# Patient Record
Sex: Female | Born: 1970 | Race: White | Hispanic: No | Smoking: Never smoker
Health system: Southern US, Academic
[De-identification: ages and names within clinical notes are randomized; demographics above are authoritative.]

## PROBLEM LIST (undated history)

## (undated) DIAGNOSIS — R569 Unspecified convulsions: Secondary | ICD-10-CM

## (undated) DIAGNOSIS — F419 Anxiety disorder, unspecified: Secondary | ICD-10-CM

## (undated) DIAGNOSIS — G35 Multiple sclerosis: Secondary | ICD-10-CM

## (undated) HISTORY — DX: Unspecified convulsions (CMS HCC): R56.9

## (undated) HISTORY — DX: Anxiety disorder, unspecified: F41.9

## (undated) HISTORY — DX: Multiple sclerosis (CMS HCC): G35

## (undated) NOTE — Progress Notes (Signed)
 Formatting of this note is different from the original.  Subjective   Patient ID: Carol Barajas is a 52 y.o. female presenting to the Urgent Care with a chief complaint of Diarrhea (Pt has had diarrhea and vomiting x 2 days).    Nausea and vomiting and diarrhea x 2 days was exposed to the stomach virus she reports     History provided by:  Patient  Diarrhea  Quality:  Watery  Severity:  Mild  Onset quality:  Sudden  Number of episodes:  3  Duration:  3 days  Timing:  Intermittent  Progression:  Unchanged  Relieved by:  Nothing  Worsened by:  Nothing  Ineffective treatments:  None tried  Associated symptoms: vomiting    Risk factors: sick contacts      Objective   Vitals:    03/10/24 1001   BP: 122/82   BP Location: Right arm   Patient Position: Sitting   BP Cuff Size: Adult   Pulse: (!) 101   Temp: 36.7 C (98.1 F)   TempSrc: Oral   Weight: 64.4 kg (142 lb)   Height: 1.575 m (5' 2)   BMI (Calculated): 25.97 kg/m2   BSA (Calculated - sq m): 1.68 sq meters   Resp: 17   SpO2: 95%   PainSc: 0-No pain     OB Vitals  OB Status Postmenopausal     Social History     Tobacco Use   Smoking Status Never    Passive exposure: Never   Smokeless Tobacco Never       Vital signs reviewed.    Physical Exam  Constitutional:       Appearance: Normal appearance. She is normal weight.   HENT:      Head: Normocephalic.      Right Ear: Tympanic membrane, ear canal and external ear normal.      Left Ear: Tympanic membrane, ear canal and external ear normal.      Mouth/Throat:      Mouth: Mucous membranes are dry.      Pharynx: Oropharynx is clear.   Eyes:      Conjunctiva/sclera: Conjunctivae normal.   Cardiovascular:      Rate and Rhythm: Normal rate and regular rhythm.   Pulmonary:      Effort: Pulmonary effort is normal.   Abdominal:      General: Bowel sounds are normal.   Skin:     General: Skin is warm and dry.   Neurological:      General: No focal deficit present.      Mental Status: She is alert and oriented to person, place,  and time. Mental status is at baseline.   Psychiatric:         Mood and Affect: Mood normal.         Behavior: Behavior normal.         Thought Content: Thought content normal.         Judgment: Judgment normal.         Assessment & Plan  Cyclical vomiting associated with intractable migraine    Orders:    POCT Influenza A/B    Bilious vomiting with nausea    Orders:    ondansetron (Zofran) injection 4 mg    ondansetron ODT (Zofran-ODT) 4 MG disintegrating tablet; Place 1 tablet under the tongue 3 times a day as needed for nausea or vomiting for up to 5 days.    Diarrhea, unspecified type    Recommend OTC Imodium  as package directed   Other orders    saccharomyces boulardii (Florastor) 250 MG capsule; Take 1 capsule by mouth in the morning and 1 capsule before bedtime. Do all this for 7 days.    In-House Lab Results:     Results for orders placed or performed in visit on 03/10/24   POCT Influenza A/B    Collection Time: 03/10/24 10:26 AM   Result Value Ref Range    Rapid Influenza A Ag Negative Negative    Rapid Influenza B Ag Negative Negative       In-House Imaging Reads:        Procedure Documentation:  Procedures         ED Course & MDM   MDM - Medical Decision Making: Independent historian used.     Electronically signed by Avelina Kerns, CRNP at 03/10/2024 11:08 AM EST

---

## 1993-05-26 ENCOUNTER — Inpatient Hospital Stay (HOSPITAL_COMMUNITY): Payer: Self-pay | Admitting: OBSTETRICS/GYNECOLOGY

## 2009-05-25 DIAGNOSIS — K529 Noninfective gastroenteritis and colitis, unspecified: Secondary | ICD-10-CM | POA: Insufficient documentation

## 2011-01-13 DIAGNOSIS — G40909 Epilepsy, unspecified, not intractable, without status epilepticus: Secondary | ICD-10-CM | POA: Insufficient documentation

## 2011-01-14 DIAGNOSIS — F32A Depression, unspecified: Secondary | ICD-10-CM | POA: Insufficient documentation

## 2011-01-14 DIAGNOSIS — G4730 Sleep apnea, unspecified: Secondary | ICD-10-CM | POA: Insufficient documentation

## 2011-01-14 DIAGNOSIS — D509 Iron deficiency anemia, unspecified: Secondary | ICD-10-CM | POA: Insufficient documentation

## 2011-01-14 DIAGNOSIS — G35 Multiple sclerosis: Secondary | ICD-10-CM | POA: Insufficient documentation

## 2012-04-23 DIAGNOSIS — R1011 Right upper quadrant pain: Secondary | ICD-10-CM

## 2014-05-24 IMAGING — CT CT ABDOMEN&PELVIS W/WO CONTRAST
1 of 4 series · 9 of 32 positions shown, 15 images · IV contrast (APPLIED)
Comparison: None.

Exam:   
CT abdomen and pelvis without and with contrast, low dose Safe CT protocol
INDICATION: Mid abdominal pain and weight loss.
TECHNIQUE: Axial CT imaging of the abdomen and pelvis was performed without and with 100 cc of Optiray 300. Oral contrast was also administered. Additional reformatted sagittal and coronal projections were also obtained. Radiation dose 654 mGy cm.

[Series 906: abd venous (safect) · axial · portal-venous · 0.74mm/px · z∈[-698,-365]mm · 9 of 139 slices shown, 15 images]
[im 14/139  soft-tissue]
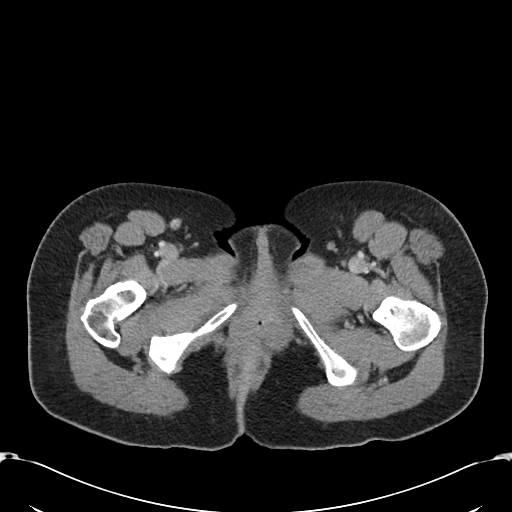
[im 14/139  bone]
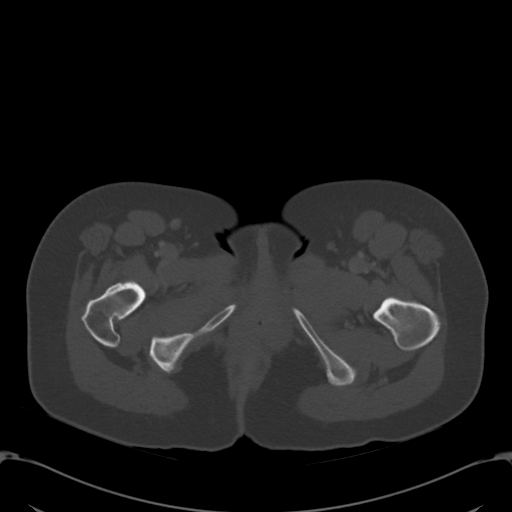
[im 28/139  soft-tissue]
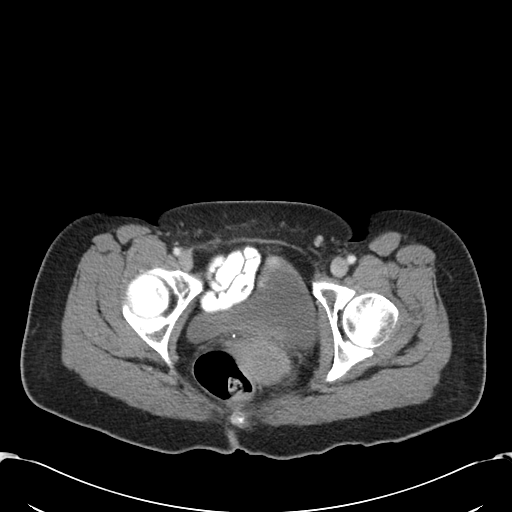
[im 42/139  soft-tissue]
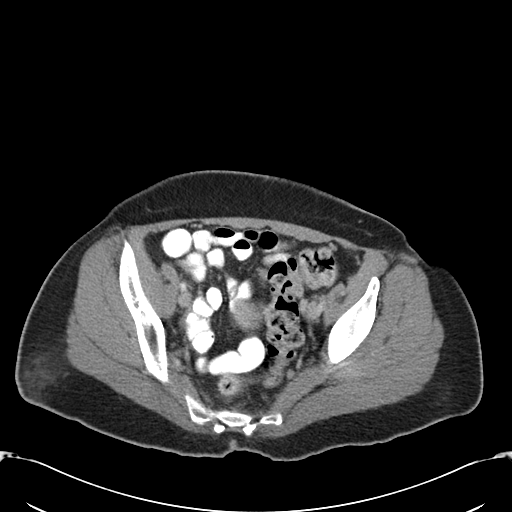
[im 56/139  soft-tissue]
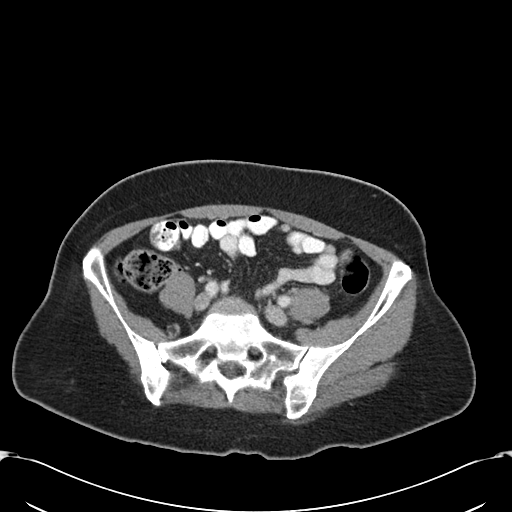
[im 70/139  soft-tissue]
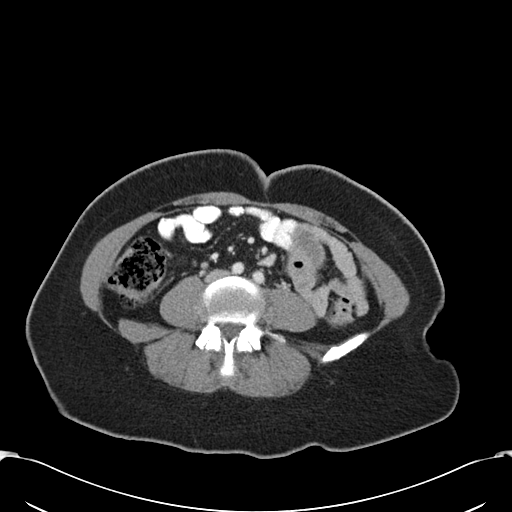
[im 83/139  soft-tissue]
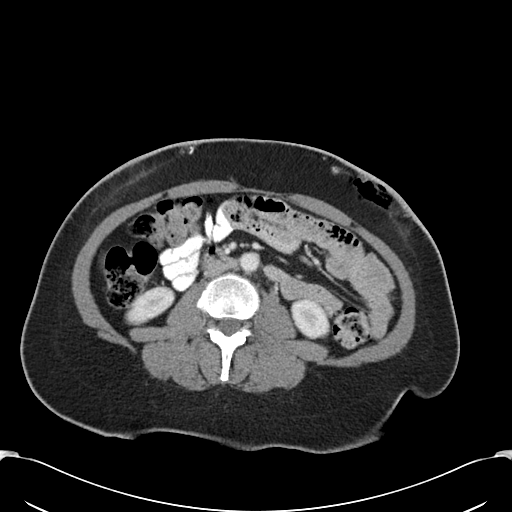
[im 83/139  lung]
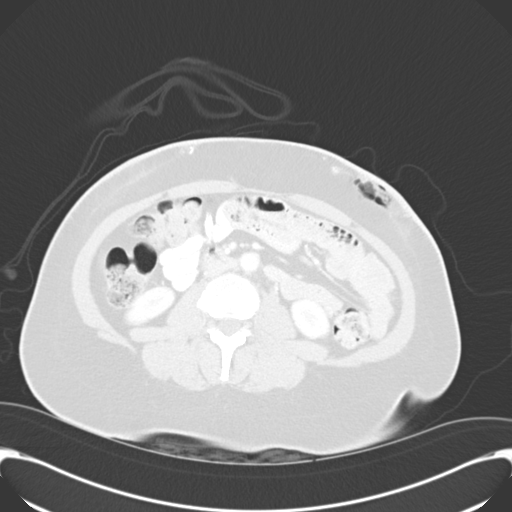
[im 97/139  soft-tissue]
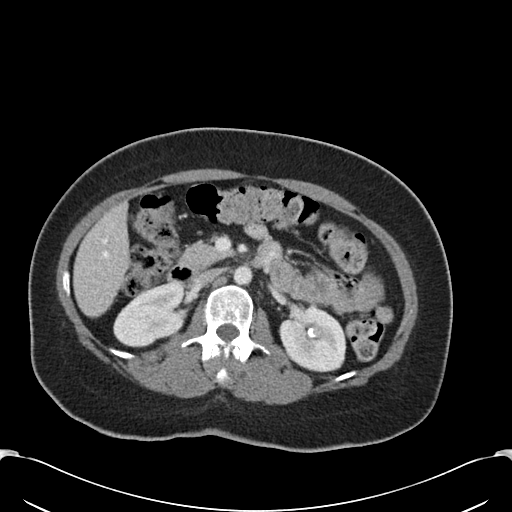
[im 97/139  lung]
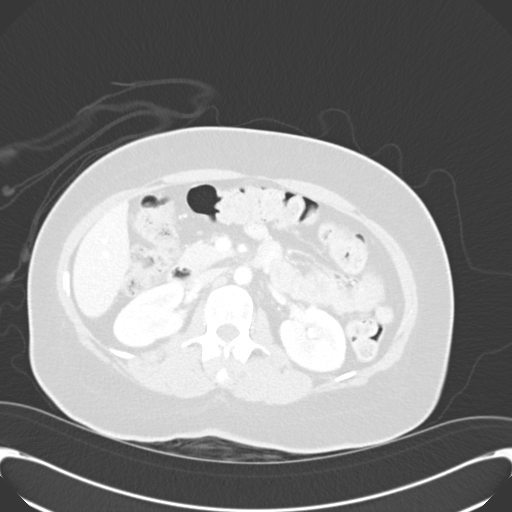
[im 111/139  soft-tissue]
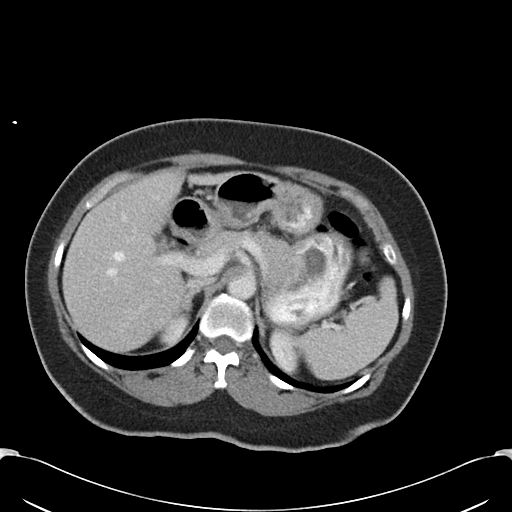
[im 111/139  lung]
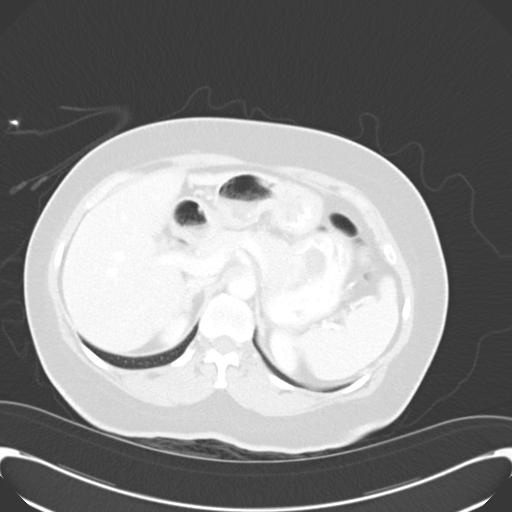
[im 125/139  soft-tissue]
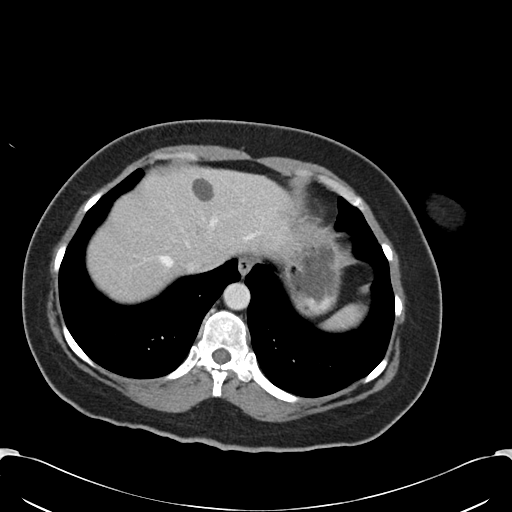
[im 125/139  lung]
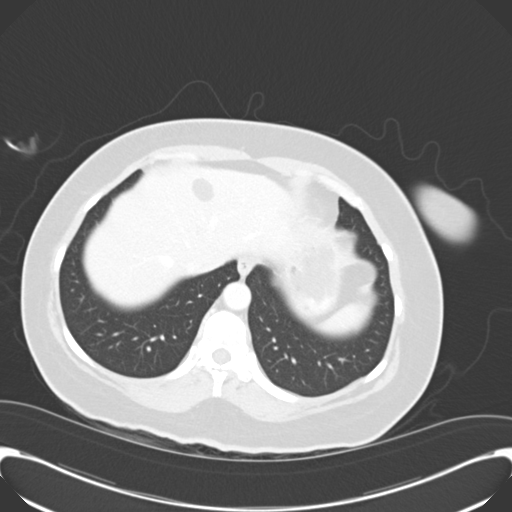
[im 125/139  bone]
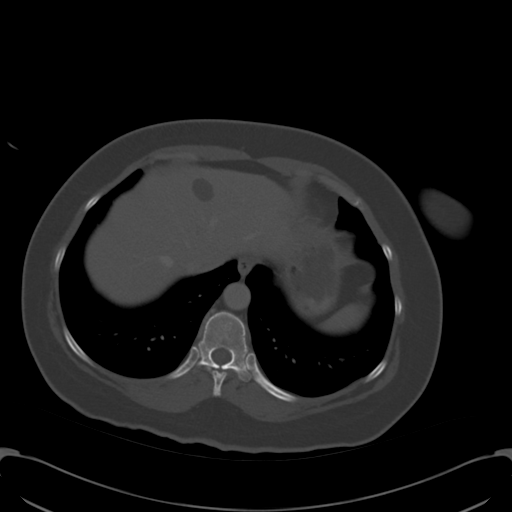

[9 of 32 positions shown; findings below may reference images not displayed]

FINDINGS: ABDOMEN:
Limited visualization of lung bases is unremarkable. There is no pleural or pericardial effusion. 
There are non obstructing bilateral renal calculi. A duplicated left sided collecting system is also noted. There are a few left hepatic lobe cysts measuring up to 1.8 cm. Gallbladder, spleen, pancreas and adrenal glands are normal. Bowel loops are normal in course and caliber; there is no obstruction or free air. There is no adenopathy. Calcifications and air within the subcutaneous tissues of the upper abdomen may represent subcutaneous injection sites.
PELVIS:
There is no acute inflammatory process. The appendix is normal. Moderate amount of stool is seen throughout the colon. There is no pelvic free fluid. There is no adenopathy. Osseous structures are unremarkable.
IMPRESSION: 1.
No acute abnormality within the upper abdomen and pelvis. The appendix is normal. 
2.
Small left hepatic lobe cysts. 

3.
Non obstructing bilateral renal calculi. There is no evidence of obstructive uropathy.

## 2020-01-24 IMAGING — MR MRI BRAIN WITHOUT CONTRAST
8 of 10 series · 32 of 48 positions shown · non-contrast
Comparison: None available.

﻿EXAM:  MRI BRAIN WITHOUT CONTRAST
INDICATION: History of multiple sclerosis.
TECHNIQUE: Noncontrast multiplanar multisequential MRI of the brain was performed.

[Series 9: DWI · axial · 5.0mm · 1.35mm/px · z∈[-53,+73]mm · 8 of 88 slices shown (1 of 3)]
[im 1/88]
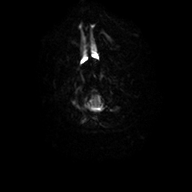
[im 10/88]
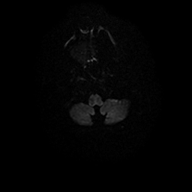
[im 30/88]
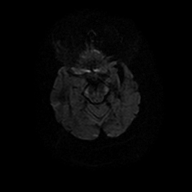
[im 39/88]
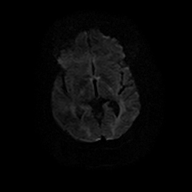
[im 49/88]
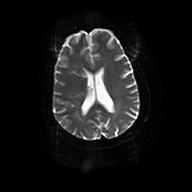
[im 59/88]
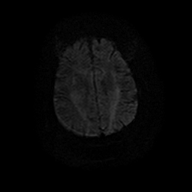
[im 78/88]
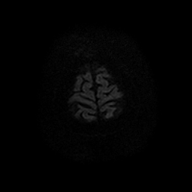
[im 88/88]
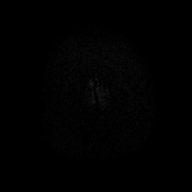

[Series 10: DWI · axial · 5.0mm · 1.35mm/px · z∈[-53,+73]mm · 2 of 22 slices shown (2 of 3)]
[im 1/22]
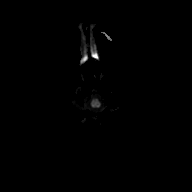
[im 22/22]
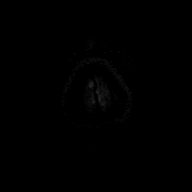

[Series 11: DWI · axial · 5.0mm · 1.35mm/px · z∈[-53,+73]mm · 3 of 22 slices shown (3 of 3)]
[im 1/22]
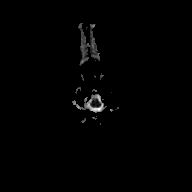
[im 11/22]
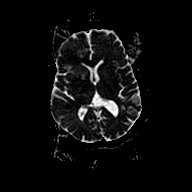
[im 22/22]
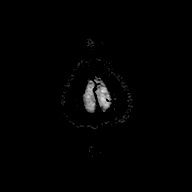

[Series 12: FLAIR · sagittal · 5.0mm · 0.75mm/px · 4 of 28 slices shown (1 of 2)]
[im 1/28]
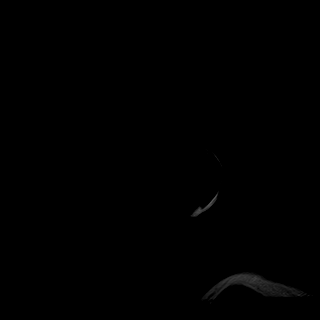
[im 10/28]
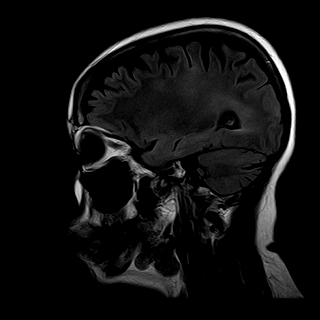
[im 19/28]
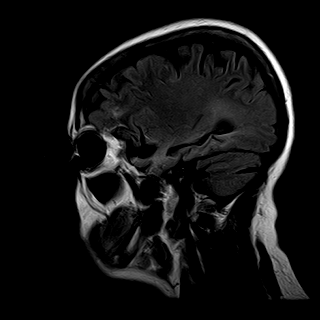
[im 28/28]
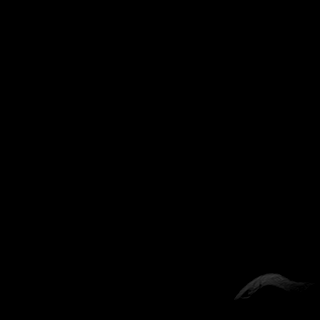

[Series 13: PD · axial · 4.0mm · 0.43mm/px · 1 of 36 slices shown]
[im 1/36]
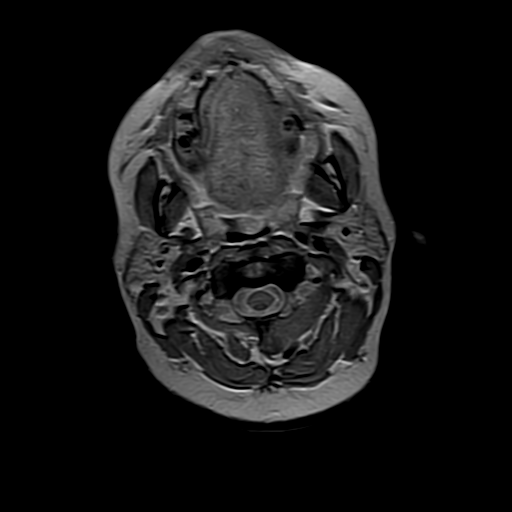

[Series 15: FLAIR · axial · 4.0mm · 0.43mm/px · z∈[-69,+85]mm · 5 of 36 slices shown (2 of 2)]
[im 1/36]
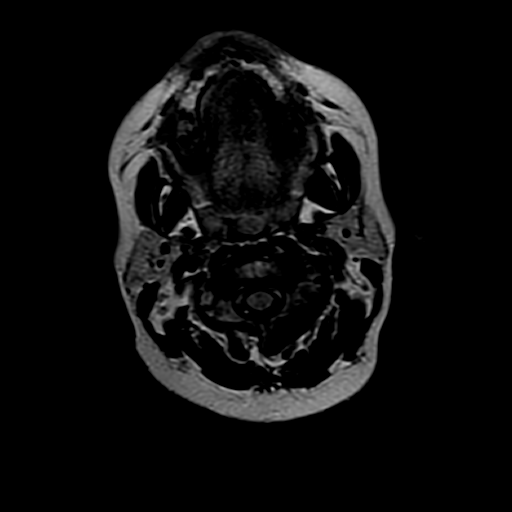
[im 9/36]
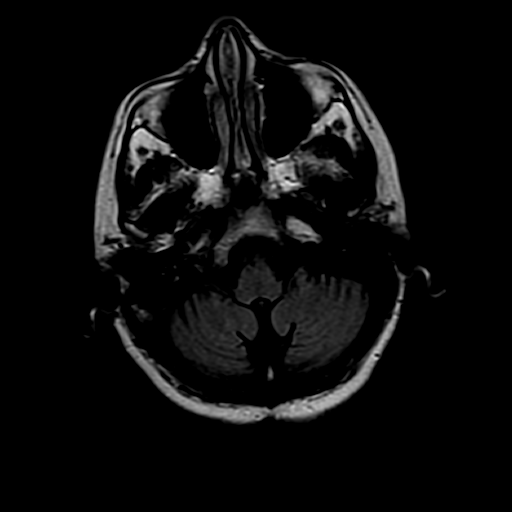
[im 18/36]
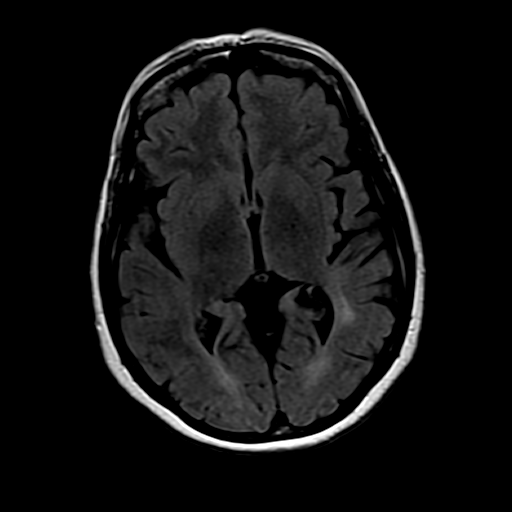
[im 27/36]
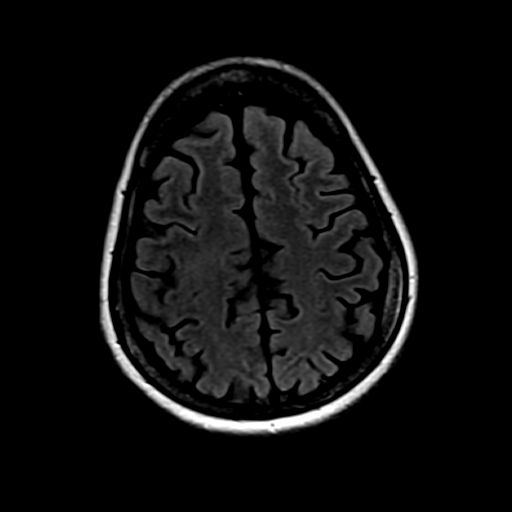
[im 36/36]
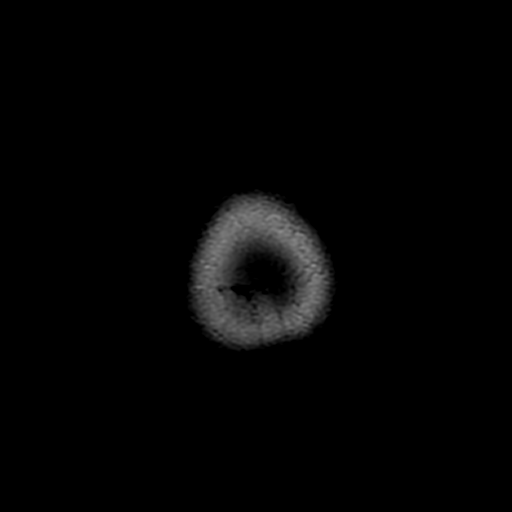

[Series 17: T1 · axial · 4.0mm · 0.43mm/px · z∈[-69,+85]mm · 5 of 36 slices shown]
[im 1/36]
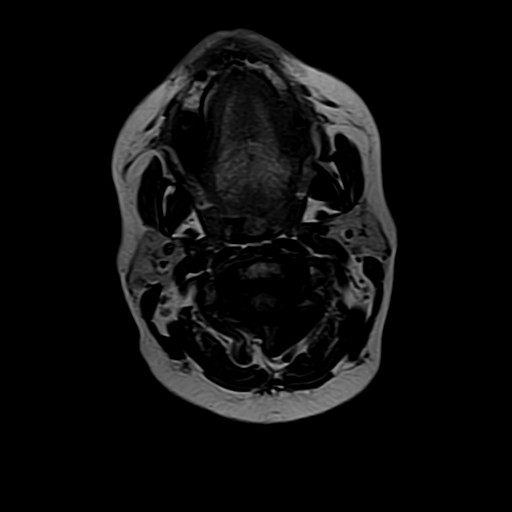
[im 9/36]
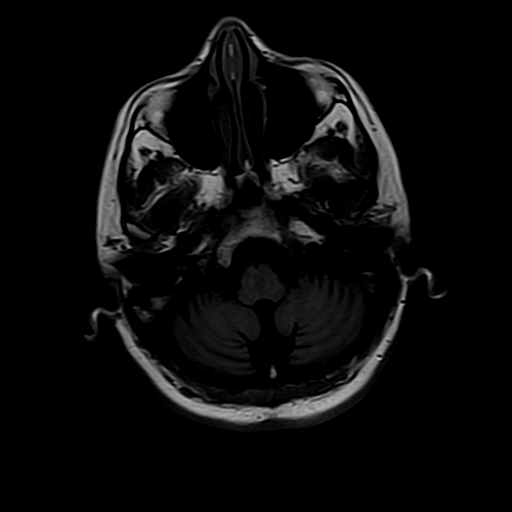
[im 18/36]
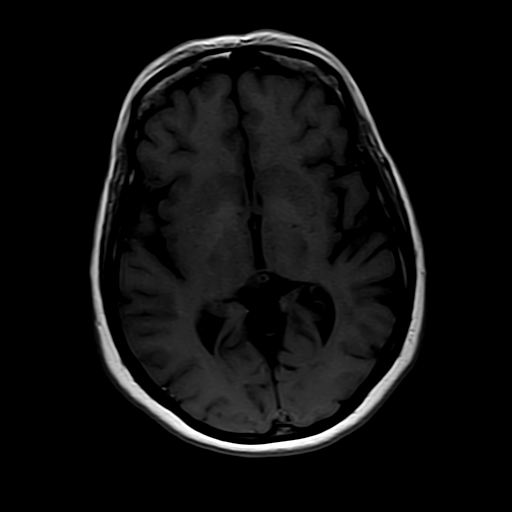
[im 27/36]
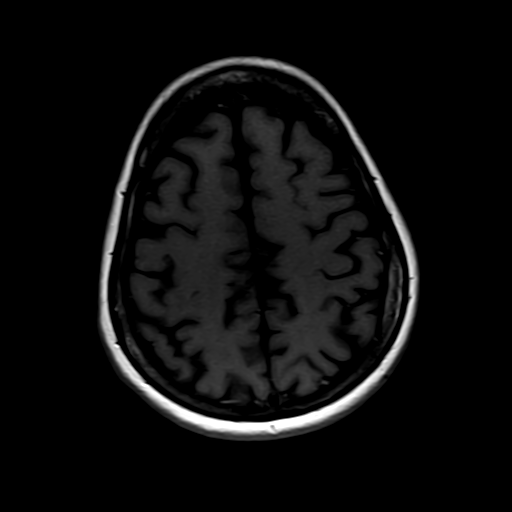
[im 36/36]
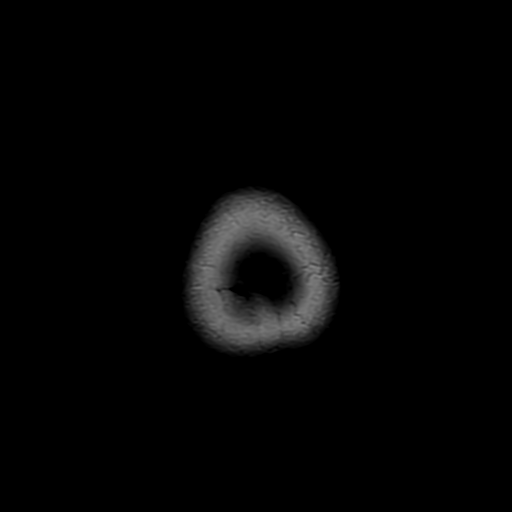

[Series 18: T2 · coronal · 5.0mm · 0.43mm/px · 4 of 28 slices shown]
[im 1/28]
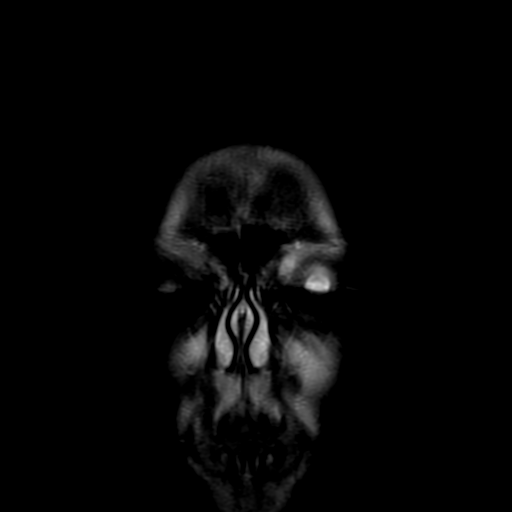
[im 10/28]
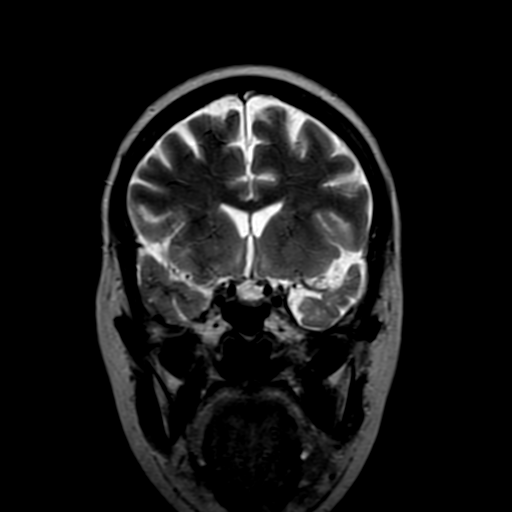
[im 19/28]
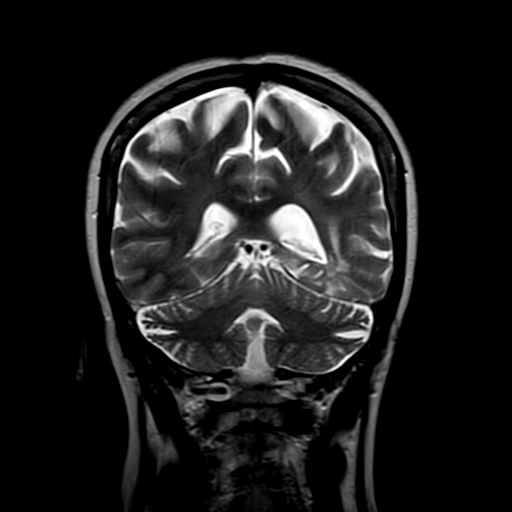
[im 28/28]
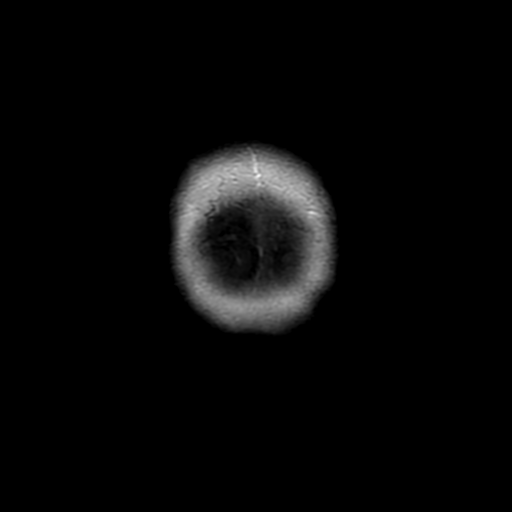

[32 of 48 positions shown; findings below may reference images not displayed]

FINDINGS: There is moderate cortical atrophy. There is a single 7.5 mm T2/FLAIR signal hyperintensity lesion abutting the right lateral ventricle. A small similar signal abnormality area is also noted within the anterior subcortical left frontal lobe. Confluent abnormal T2/FLAIR signal hyperintensity within the left temporal lobe with volume loss is likely from remote infarction. There is no mass effect, midline shift or intracranial hemorrhage. There is no evidence of acute infarction or prior microhemorrhages. Skull base flow voids and basal cisterns are patent. Sagittal survey of midline structures is unremarkable. There are no extra-axial fluid collections. Visualized paranasal sinuses, mastoid air cells and orbital contents are unremarkable.
IMPRESSION: Suggestion of a chronic left temporal lobe infarct. No convincing evidence of demyelinating disease. Continued clinical follow-up is recommended.

## 2020-02-13 IMAGING — MR MRI CERVICAL SPINE WITHOUT CONTRAST
6 series · 48 of 48 positions shown · non-contrast
Comparison: MRI brain dated 01/24/2020.

﻿EXAM:  MRI CERVICAL SPINE WITHOUT CONTRAST

EXAM:  MRI THORACIC SPINE WITHOUT CONTRAST
INDICATION: 49-year-old female with history of multiple sclerosis.
TECHNIQUE: Sagittal, coronal and axial images including T1, T2 and STIR sequences through cervical spine and thoracic spine.

[Series 4: s-map · sagittal · 8.8mm · 4.38mm/px · 29 of 100 slices shown]
[im 1/100]
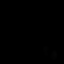
[im 4/100]
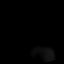
[im 8/100]
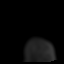
[im 11/100]
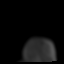
[im 15/100]
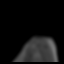
[im 18/100]
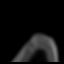
[im 22/100]
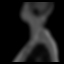
[im 25/100]
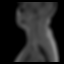
[im 29/100]
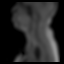
[im 32/100]
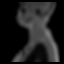
[im 36/100]
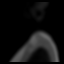
[im 39/100]
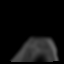
[im 43/100]
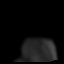
[im 46/100]
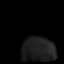
[im 50/100]
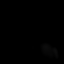
[im 54/100]
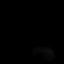
[im 57/100]
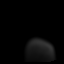
[im 61/100]
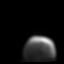
[im 64/100]
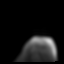
[im 68/100]
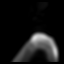
[im 71/100]
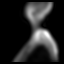
[im 75/100]
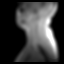
[im 78/100]
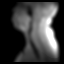
[im 82/100]
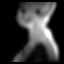
[im 85/100]
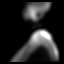
[im 89/100]
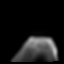
[im 92/100]
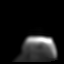
[im 96/100]
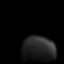
[im 100/100]
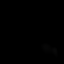

[Series 6: T2 · sagittal · 3.0mm · 0.78mm/px · 3 of 11 slices shown (1 of 2)]
[im 1/11]
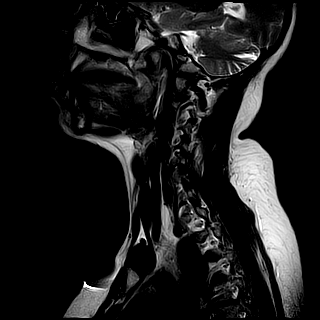
[im 6/11]
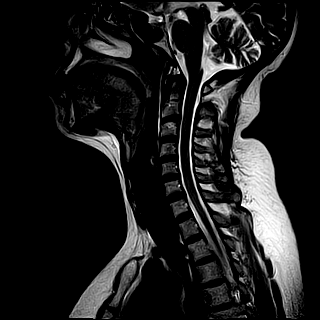
[im 11/11]
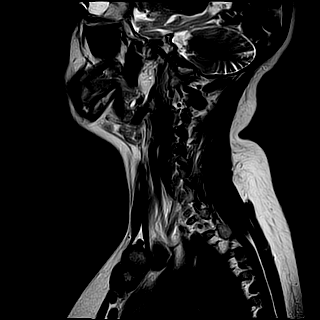

[Series 7: T1 · sagittal · 3.0mm · 0.49mm/px · 3 of 11 slices shown]
[im 1/11]
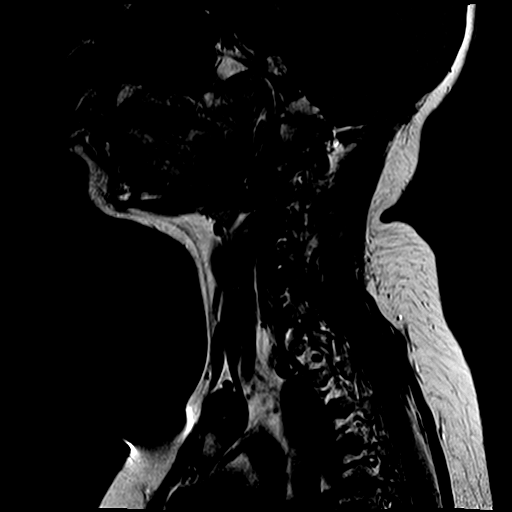
[im 6/11]
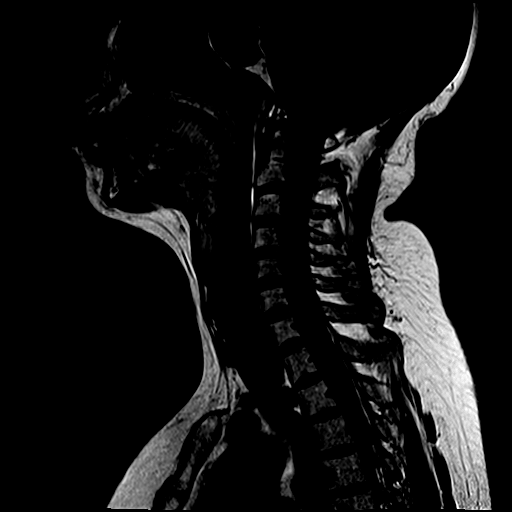
[im 11/11]
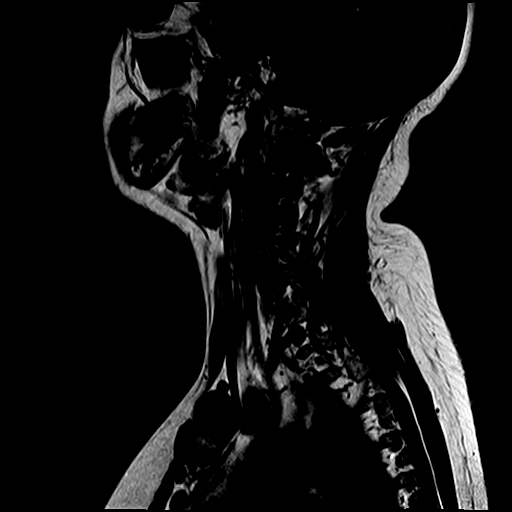

[Series 9: STIR · sagittal · 3.0mm · 0.49mm/px · 3 of 11 slices shown]
[im 1/11]
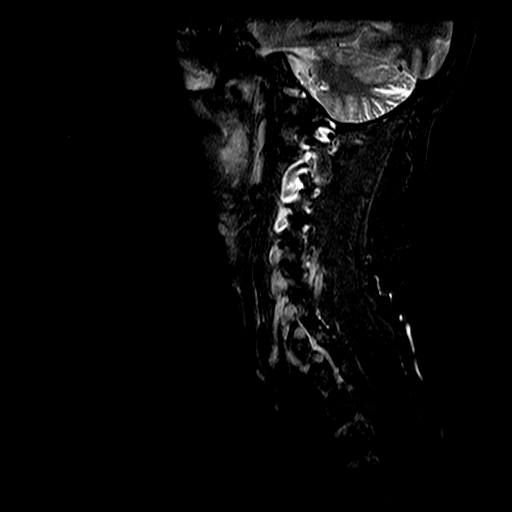
[im 6/11]
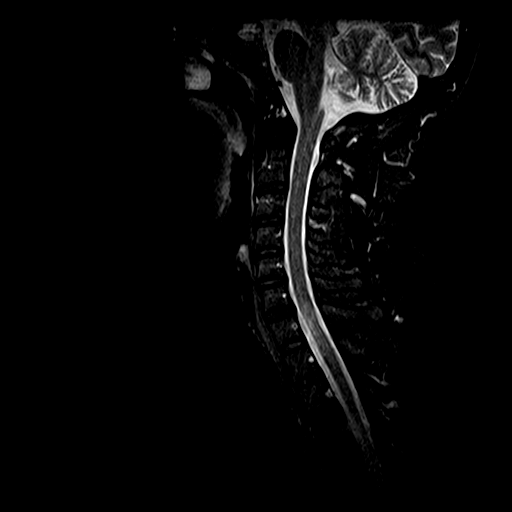
[im 11/11]
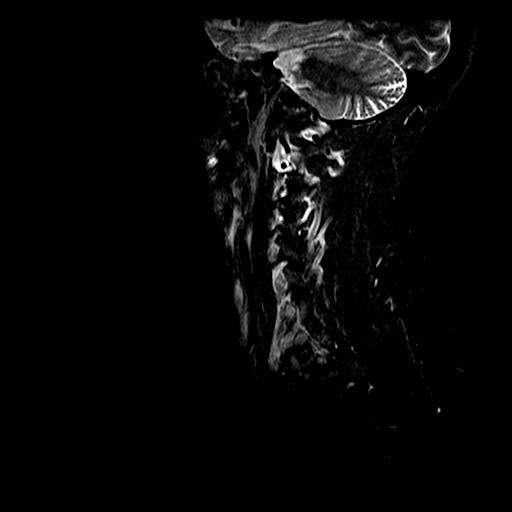

[Series 10: T2-star · axial · 3.5mm · 0.31mm/px · z∈[-69,+28]mm · 5 of 18 slices shown]
[im 1/18]
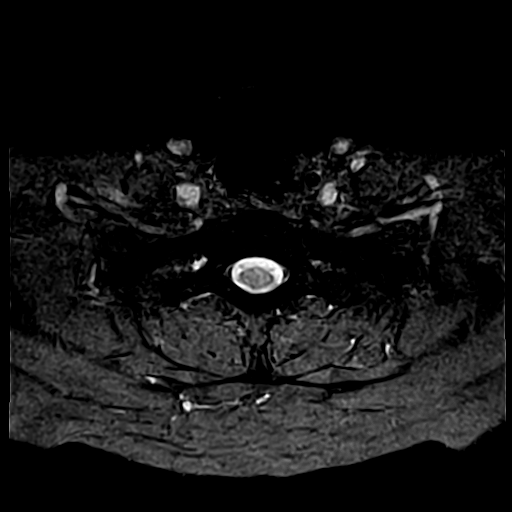
[im 5/18]
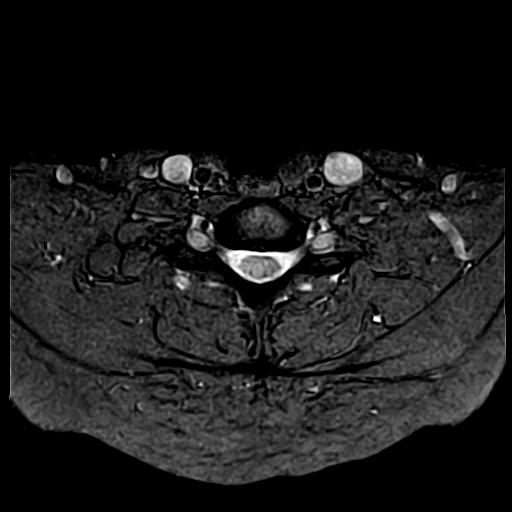
[im 9/18]
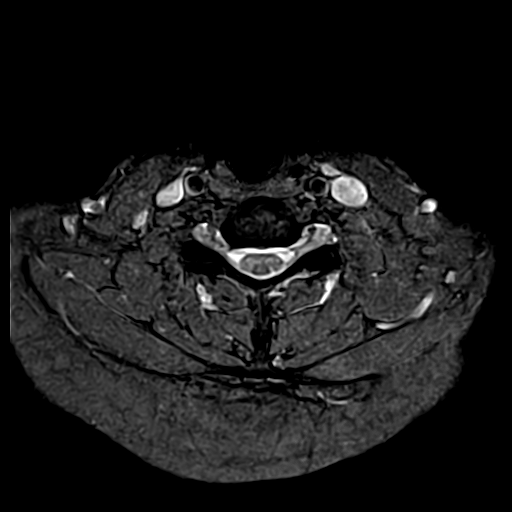
[im 13/18]
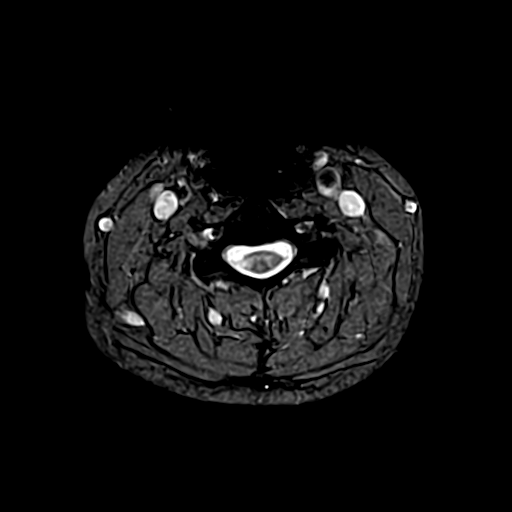
[im 18/18]
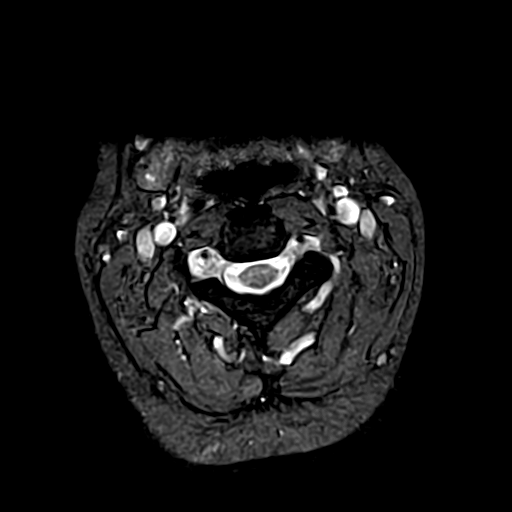

[Series 11: T2 · axial · 3.5mm · 0.31mm/px · z∈[-69,+28]mm · 5 of 18 slices shown (2 of 2)]
[im 1/18]
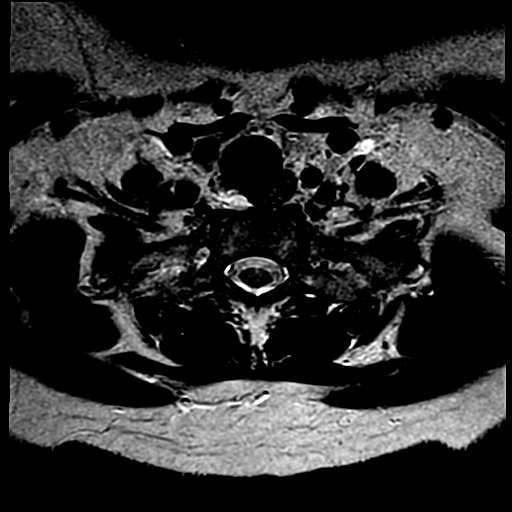
[im 5/18]
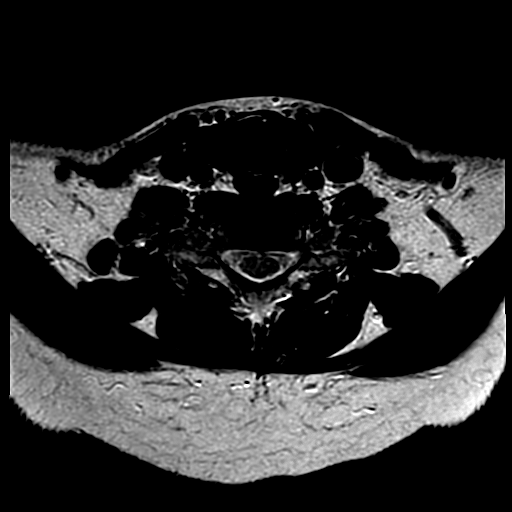
[im 9/18]
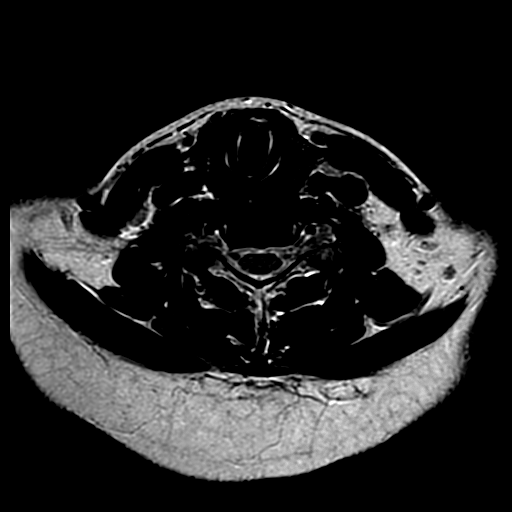
[im 13/18]
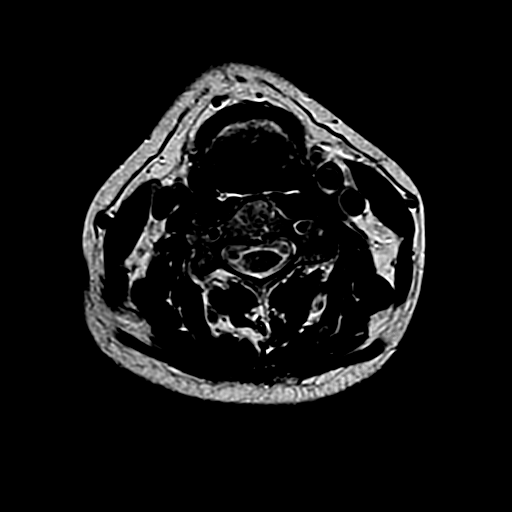
[im 18/18]
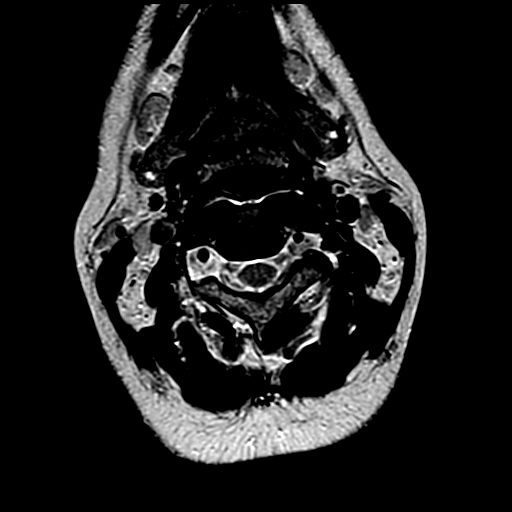

[48 of 48 positions shown; findings below may reference images not displayed]

FINDINGS: No acute bony lesions of cervical vertebrae are seen.  Posterior fossa and foramen magnum structures are normal in the sagittal projection. 

No significant cervical disc herniation is noted.  At C6-C7 level, mild degenerative disc disease with asymmetric bulging annulus and osteophyte complex causing mild bilateral foraminal narrowing, left more than the right.  

No focal lesions of thoracic vertebral body are noted.  No thoracic disc herniation, spinal stenosis or foraminal stenosis is noted.  

Cervical and thoracic spinal cord do not show any focal abnormalities or signal abnormalities.  No syringomyelia is seen.  Paravertebral soft tissues are normal.
IMPRESSION: 1. No focal bone changes of cervical and thoracic vertebrae.  No significant disc herniation or spinal stenosis is noted.  

2. No focal lesions of the spinal cord in the cervical and thoracic regions are noted in this examination.  Paravertebral soft tissues are unremarkable.

## 2020-02-13 IMAGING — MR MRI THORACIC SPINE WITHOUT CONTRAST
4 series · 21 of 48 positions shown · non-contrast
Comparison: MRI brain dated 01/24/2020.

﻿EXAM:  MRI CERVICAL SPINE WITHOUT CONTRAST

EXAM:  MRI THORACIC SPINE WITHOUT CONTRAST
INDICATION: 49-year-old female with history of multiple sclerosis.
TECHNIQUE: Sagittal, coronal and axial images including T1, T2 and STIR sequences through cervical spine and thoracic spine.

[Series 6: T2 · sagittal · 4.0mm · 0.78mm/px · 9 of 13 slices shown]
[im 1/13]
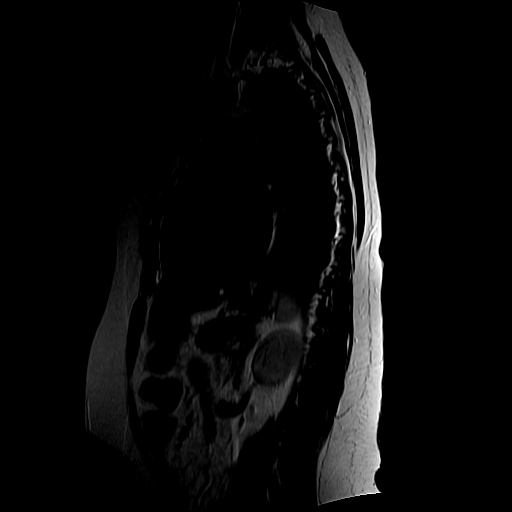
[im 3/13]
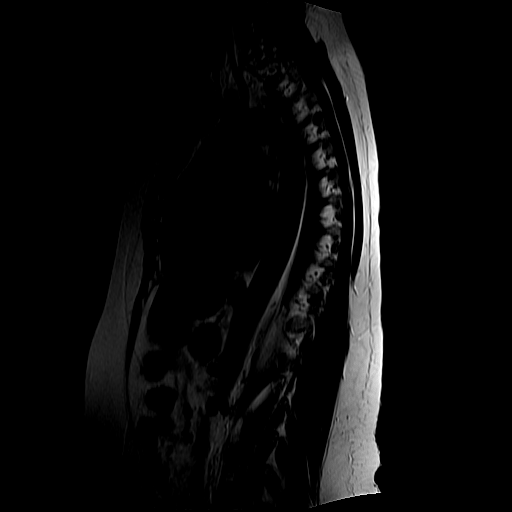
[im 5/13]
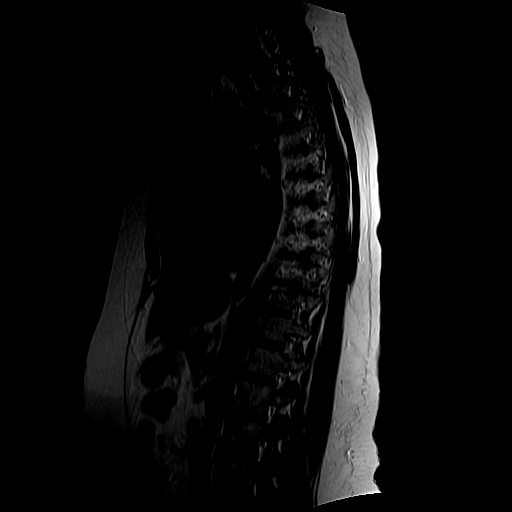
[im 6/13]
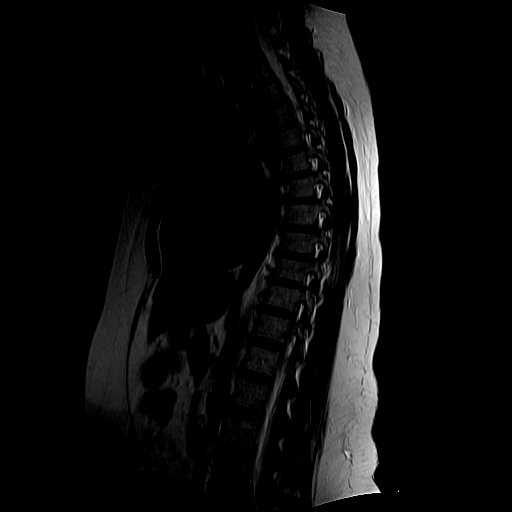
[im 7/13]
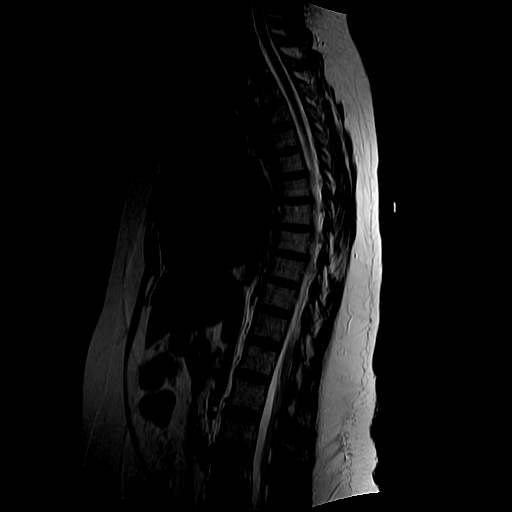
[im 8/13]
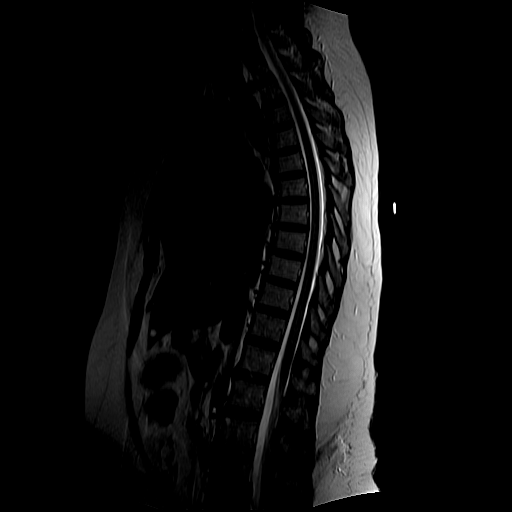
[im 9/13]
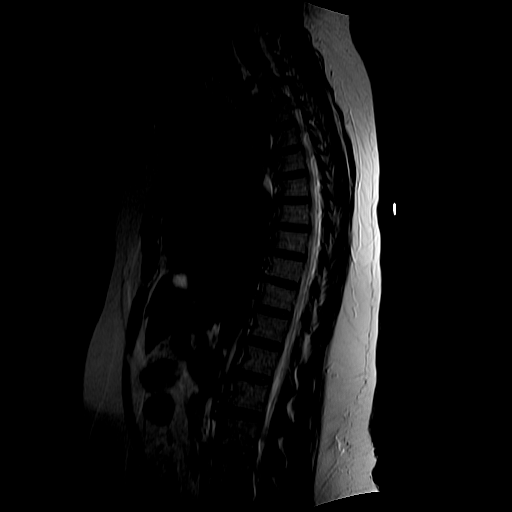
[im 11/13]
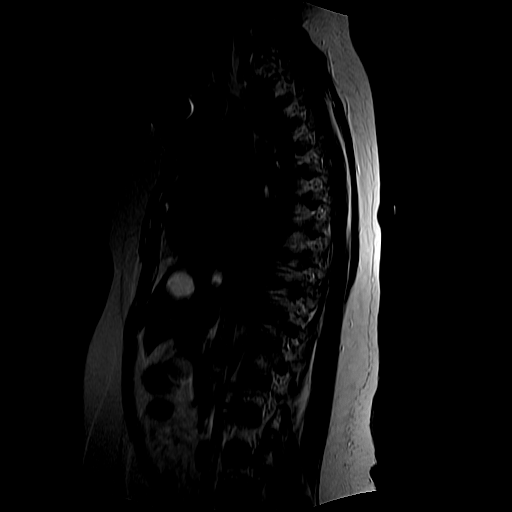
[im 13/13]
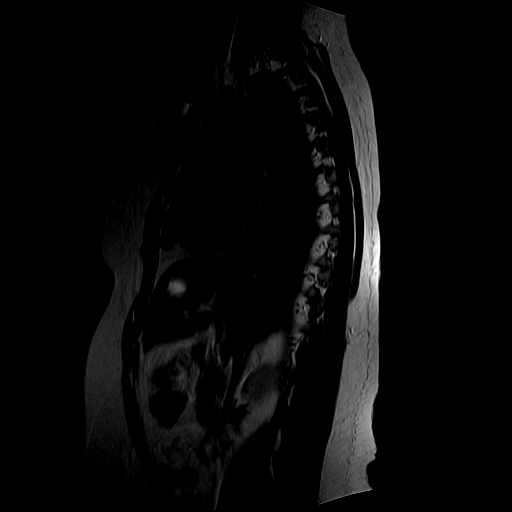

[Series 7: T1 · sagittal · 4.0mm · 0.78mm/px · 6 of 13 slices shown (1 of 2)]
[im 1/13]
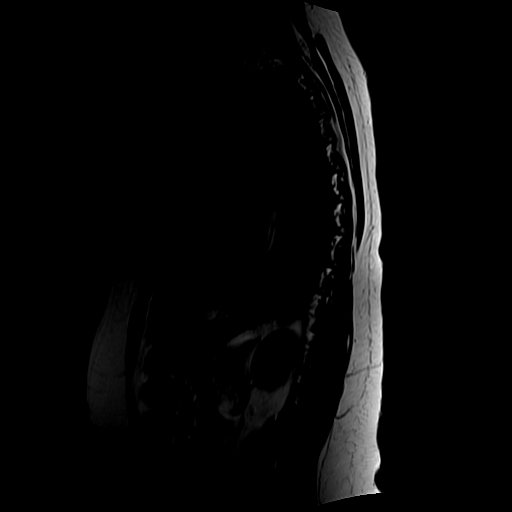
[im 3/13]
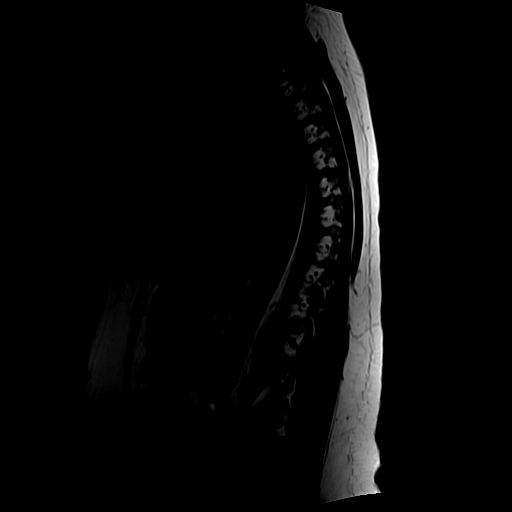
[im 4/13]
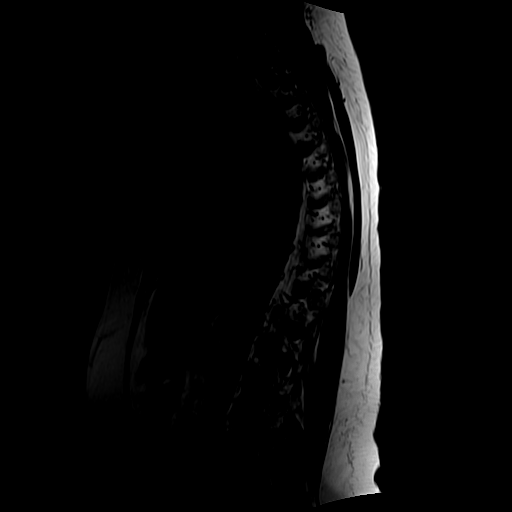
[im 6/13]
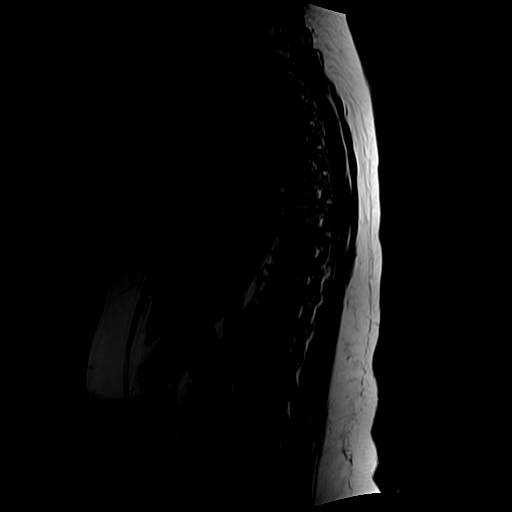
[im 7/13]
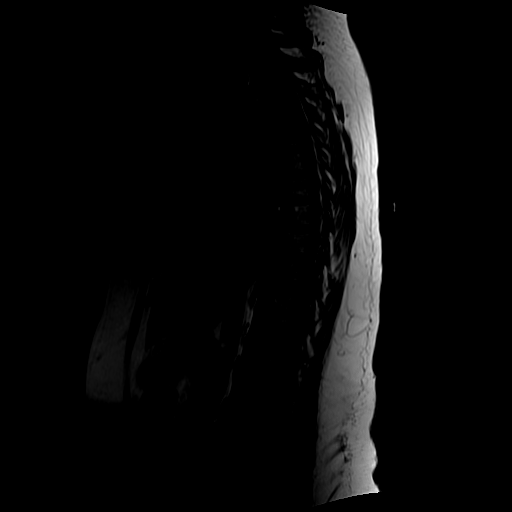
[im 11/13]
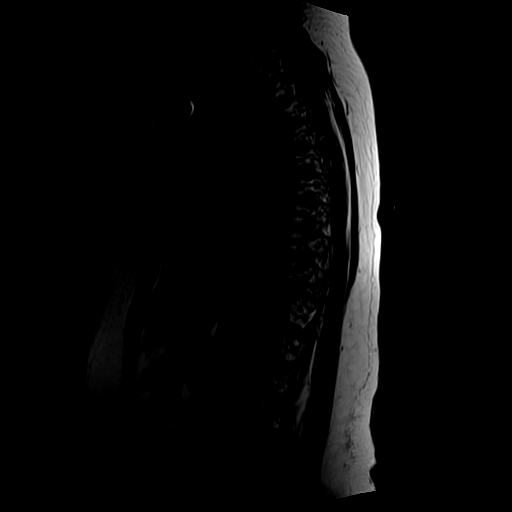

[Series 8: STIR · sagittal · 4.0mm · 0.78mm/px · 3 of 13 slices shown]
[im 3/13]
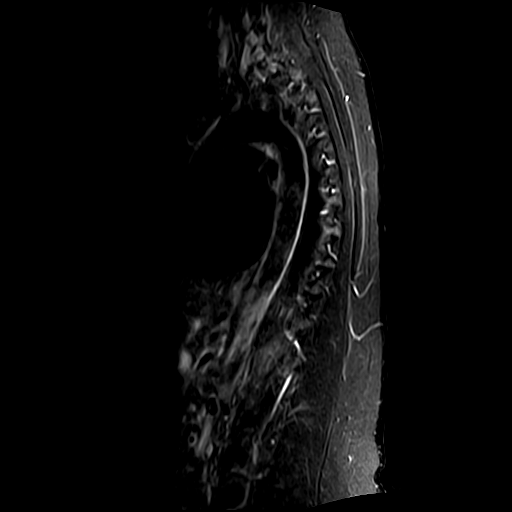
[im 7/13]
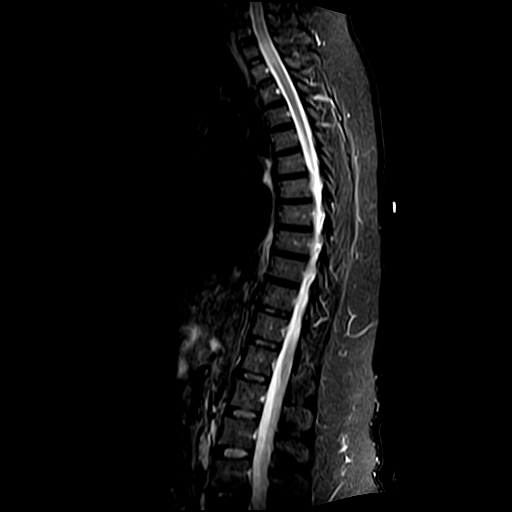
[im 11/13]
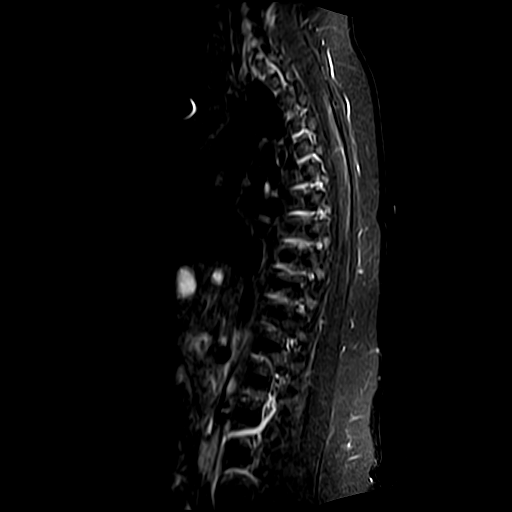

[Series 11: T1 · axial · 4.0mm · 0.62mm/px · z∈[-240,-110]mm · 3 of 12 slices shown (2 of 2)]
[im 2/12]
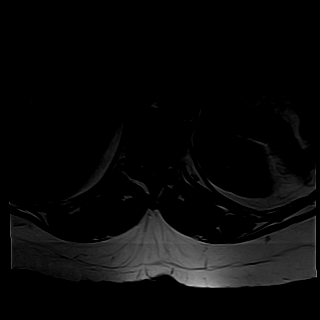
[im 6/12]
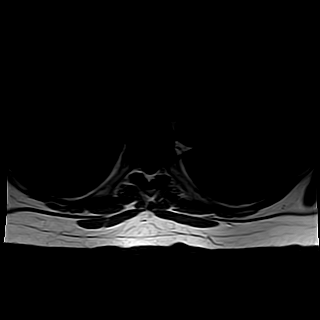
[im 10/12]
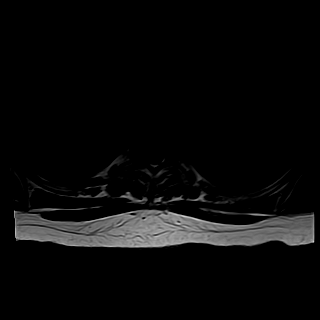

[21 of 48 positions shown; findings below may reference images not displayed]

FINDINGS: No acute bony lesions of cervical vertebrae are seen.  Posterior fossa and foramen magnum structures are normal in the sagittal projection. 

No significant cervical disc herniation is noted.  At C6-C7 level, mild degenerative disc disease with asymmetric bulging annulus and osteophyte complex causing mild bilateral foraminal narrowing, left more than the right.  

No focal lesions of thoracic vertebral body are noted.  No thoracic disc herniation, spinal stenosis or foraminal stenosis is noted.  

Cervical and thoracic spinal cord do not show any focal abnormalities or signal abnormalities.  No syringomyelia is seen.  Paravertebral soft tissues are normal.
IMPRESSION: 1. No focal bone changes of cervical and thoracic vertebrae.  No significant disc herniation or spinal stenosis is noted.  

2. No focal lesions of the spinal cord in the cervical and thoracic regions are noted in this examination.  Paravertebral soft tissues are unremarkable.

## 2021-07-23 ENCOUNTER — Telehealth (RURAL_HEALTH_CENTER): Payer: Self-pay | Admitting: INTERNAL MEDICINE

## 2021-07-23 ENCOUNTER — Encounter (RURAL_HEALTH_CENTER): Payer: Self-pay | Admitting: INTERNAL MEDICINE

## 2021-07-23 NOTE — Nursing Note (Signed)
Patient called saying that her diarrhea has gotten bad. She said she cut out milk products but still has the diarrhea even after using the cholestyramine. She wanted to know if there is something else she can take for it.

## 2021-07-23 NOTE — Nursing Note (Signed)
Patient was notified.

## 2021-07-23 NOTE — Telephone Encounter (Signed)
Patient has a history of chronic diarrhea, usually food related. She was started on the cholestyramine last summer and reported that the diarrhea had resolved as of November.   I have offered GI referral for this, but patient refused.  If the neurologist really thinks its a new viral infection, then the cholestyramine, fluid, and bland diet are the best treatment options.  There is some danger in providing things like Lomotil or Immodium for infectious causes.   If she has now definitely determined that milk causes worsening symptoms, she needs to avoid it at all reasonable cost or take Lactaid or similar if she absolutely has to eat/drink it.

## 2021-07-23 NOTE — Nursing Note (Signed)
Patient called back saying that she had spoken with her neurologist and was told that she had a stomach virus. She said that she had eaten ice cream which made it worse. Patient did not want to come in today saying she had an appointment on the 30th of this month. She was informed to drink clear liquids and eat crackers until the diarrhea cleared up. Patient also said that she has been taking her cholestyramine when needed.

## 2021-07-30 ENCOUNTER — Ambulatory Visit (RURAL_HEALTH_CENTER): Payer: Self-pay | Admitting: INTERNAL MEDICINE

## 2021-08-02 ENCOUNTER — Encounter (RURAL_HEALTH_CENTER): Payer: Self-pay | Admitting: INTERNAL MEDICINE

## 2021-08-02 ENCOUNTER — Other Ambulatory Visit: Payer: Self-pay

## 2021-08-02 ENCOUNTER — Ambulatory Visit (RURAL_HEALTH_CENTER): Payer: Medicare Other | Attending: INTERNAL MEDICINE | Admitting: INTERNAL MEDICINE

## 2021-08-02 VITALS — BP 138/77 | HR 78 | Resp 16 | Ht 65.0 in | Wt 144.4 lb

## 2021-08-02 DIAGNOSIS — G35 Multiple sclerosis: Secondary | ICD-10-CM

## 2021-08-02 DIAGNOSIS — F339 Major depressive disorder, recurrent, unspecified: Secondary | ICD-10-CM | POA: Insufficient documentation

## 2021-08-02 DIAGNOSIS — G40909 Epilepsy, unspecified, not intractable, without status epilepticus: Secondary | ICD-10-CM

## 2021-08-02 DIAGNOSIS — D509 Iron deficiency anemia, unspecified: Secondary | ICD-10-CM | POA: Insufficient documentation

## 2021-08-02 DIAGNOSIS — K7689 Other specified diseases of liver: Secondary | ICD-10-CM | POA: Insufficient documentation

## 2021-08-02 DIAGNOSIS — R32 Unspecified urinary incontinence: Secondary | ICD-10-CM | POA: Insufficient documentation

## 2021-08-02 DIAGNOSIS — R197 Diarrhea, unspecified: Secondary | ICD-10-CM | POA: Insufficient documentation

## 2021-08-02 DIAGNOSIS — F3341 Major depressive disorder, recurrent, in partial remission: Secondary | ICD-10-CM | POA: Insufficient documentation

## 2021-08-02 DIAGNOSIS — F32A Depression, unspecified: Secondary | ICD-10-CM

## 2021-08-02 MED ORDER — ERGOCALCIFEROL (VITAMIN D2) 1,250 MCG (50,000 UNIT) CAPSULE
50000.0000 [IU] | ORAL_CAPSULE | ORAL | 1 refills | Status: DC
Start: 2021-08-02 — End: 2021-11-05

## 2021-08-02 MED ORDER — OXYBUTYNIN CHLORIDE 5 MG TABLET
5.0000 mg | ORAL_TABLET | Freq: Every day | ORAL | 1 refills | Status: DC
Start: 2021-08-02 — End: 2021-11-05

## 2021-08-02 MED ORDER — FLUOXETINE 40 MG CAPSULE
40.0000 mg | ORAL_CAPSULE | Freq: Every day | ORAL | 1 refills | Status: DC
Start: 2021-08-02 — End: 2021-11-05

## 2021-08-02 MED ORDER — CHOLESTYRAMINE (WITH SUGAR) 4 GRAM POWDER FOR SUSP IN A PACKET
4.0000 g | Freq: Two times a day (BID) | ORAL | 1 refills | Status: DC
Start: 2021-08-02 — End: 2021-11-05

## 2021-08-02 NOTE — Nursing Note (Signed)
Patient is here for her follow up. Patient reports no new problems.

## 2021-08-02 NOTE — Progress Notes (Signed)
[facility]  [address]  [place], [place]  [address]  Phone: [phone] Fax: [phone]    Name: Carol Barajas                       Date of Birth: 03/13/1970   MRN:  [id number]                         Date of visit: 08/02/2021     Chief Complaint: Follow Up 3 Months (No new problems)    Current Outpatient Medications   Medication Sig   . cholestyramine-sucrose (QUESTRAN) 4 gram Oral Powder in Packet Take 1 Packet by mouth Twice daily   . ergocalciferol, vitamin D2, (DRISDOL) 1,250 mcg (50,000 unit) Oral Capsule Take 1 Capsule (50,000 Units total) by mouth Every 7 days   . ferrous sulfate (FEOSOL) 325 mg (65 mg iron) Oral Tablet Take 1 Tablet (325 mg total) by mouth Once a day   . FLUoxetine (PROZAC) 40 mg Oral Capsule Take 1 Capsule (40 mg total) by mouth Once a day   . glatiramer 40 mg/mL Subcutaneous Syringe Inject 1 mL (40 mg total) under the skin Every other day (Patient not taking: Reported on 08/02/2021)   . KESIMPTA PEN 20 mg/0.4 mL Subcutaneous Pen Injector Inject 0.4 mL (20 mg total) under the skin Every 30 days   . OXcarbazepine (TRILEPTAL) 600 mg Oral Tablet Take 1 Tablet (600 mg total) by mouth Twice daily   . oxybutynin (DITROPAN) 5 mg Oral Tablet Take 1 Tablet (5 mg total) by mouth Once a day   . TRILEPTAL 600 mg Oral Tablet Take 1 Tablet (600 mg total) by mouth Twice daily (Patient not taking: Reported on 08/02/2021)       Patient Active Problem List    Diagnosis Date Noted   . Urinary incontinence 08/02/2021   . Hepatic cyst 08/02/2021   . Major depression, recurrent (CMS HCC) 08/02/2021   . Depression 01/14/2011   . Iron deficiency anemia 01/14/2011   . Multiple sclerosis (CMS HCC) 01/14/2011   . Sleep apnea 01/14/2011   . Seizure disorder (CMS HCC) 01/13/2011   . Other and unspecified noninfectious gastroenteritis and colitis(558.9) 05/25/2009     Formatting of this note might be different from the original.  Gastroenteritis         Subjective:   Patient is here for CDM visit.    Now  wearing elbow brace and wrist brace. Patient not really sure why. She did report they were doing carpal tunnel evaluations when she was here last time.     Hepatic Cysts  Originally identified on extended imaging performed by Dr Andee Poles  03-05-21 Multiple hepatic cysts are identified.  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    Multiple sclerosis  Currently on Kesimpta  Patient was informed in March 2020 that I was not comfortable treating her MS.  Largely due to her continued deterioration and most likely need for medication adjustment in the near future.  Patient was supposed to call back with name of neurology she would like to be referred to, but she did not do so  Was following with Dr.Paudyal at his departure.  The nurse practitioners at this clinic resumed her medication, but had not really performing any monitoring.  Symptoms are gradually getting worse per patient and available documentation.  Had not seen a neurologist since August 2016 from any record I can find.  10-2018 Referral  placed to Dr Marta Antu. Apt scheduled for 11-25-2018.   Scheduled with [facility]. Scheduled for 02-09-2019.  01-2019 Patient called crying. Notes her apt with neurology was rescheduled. She will be out of medication.   *At appointment today we called Dr. Cleophus Molt office.  Secretary did note that patient was scheduled and had appointment canceled.  Patient had called and informed that she had just gone to a large wedding.  No masks or social distancing.  They did not feel safe bringing patient into office, but did offer to reschedule.  Patient refused.  02-2019 MRI There is abnormal signal predominately involving the inferomedial left temporal lobe with atrophic changes.  On T2 coronal, there is cortical thinning and subcortical increased T2 signal.  The abnormal white matter signal extends posteriorly to a point adjacent to the left occipital horn, similar to previous.  This is unchanged to 2015.  04-2019 Now following with Dr  Andee Poles in [place]  10-2019 No changes  04-2020 Noting more gradual weakness in legs, especially right. Now using cane occasionally. Not using today. No changes in medication, but they are recommending Brand Only medication.  [id number] Continuing to have gradual weakness.  01-2021 Patient given steroids. Doing much better.  04-2021 Now on Kesimpta. Notes more trouble with gait.  07-2021 Still having a lot of trouble with gait.     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  Currently on oxcarbazepine at maximum dose.  10-2018 Referral placed to Dr Marta Antu. Apt scheduled for 11-25-2018.   * Was changed to [facility]. Scheduled for 02-09-2019.  01-2019 Patient called crying. Notes her apt with neurology was rescheduled. She will be out of medication.  04-2019 Now following with Dr Andee Poles in [place].  [phone] No Changes  04-2020 No changes. They are recommending she be on Brand Only medication.  01-2021 no changes    Iron deficiency anemia  On replacement  Labs on 01-10-2019 showed hemoglobin 12.3  Labs on 10-17-2019 showed hemoglobin 12.2  Labs on 10-22-2020 showed hemoglobin 12.1    Chronic depression  Doing well with Prozac    Urinary Incontinence  Doing well with oxybutynin  We did not originally start this medication  Some risk of crossing BBB, and worsening neuro symptoms    Recurrent Diarrhea  Ongoing since at least 2011 per records  Patient thinks it may have something to do with milk, but tends to end up eating/drinking dairy products anyway  Have offered to refer patient to GI, but she refuses  Patient was prescribed Cholestyramine, but she does not take  CT 03-2021 was negative for causative etiology, but did not hepatic cysts    ROS:  10 systems reviewed and were negative except as noted.   + fatigue  + abnormal gait, back pain, joint pain, muscle weakness  + seizures  + anxiety, depression    Objective :  BP 138/77 (Site: Right, Patient Position:  Sitting, Cuff Size: Adult)   Pulse 78   Resp 16   Ht 1.651 m (5\' 5" )   Wt 65.5 kg (144 lb 6 oz)   SpO2 100%   BMI 24.03 kg/m       General:  appears in good health  Lungs:  Clear to auscultation bilaterally.   Cardiovascular:  regular rate and rhythm  Neurologic:  Grossly normal  Psychiatric:  Normal    Data reviewed:      Assessment/Plan:  Assessment/Plan   1. Multiple sclerosis (CMS  HCC)    2. Seizure disorder (CMS HCC)    3. Iron deficiency anemia    4. Depression, unspecified depression type    5. Urinary incontinence    6. Hepatic cyst    7. Recurrent major depressive disorder, in partial remission (CMS HCC)      Multiple sclerosis  Follow with Neuro.     Seizure disorder  Follow with Neuro.    Iron deficiency anemia  Continue replacement    Depression  Continue Prozac    Hepatic Cysts  Monitor    Recurrent Diarrhea  Avoid dairy  Use Lactaid or similar when cannot avoid  Use Cholestyramine to help with symptom control  Can refer to GI for further evaluation    Terald Sleeper, DO, [facility]  Internal Medicine

## 2021-08-09 ENCOUNTER — Other Ambulatory Visit: Payer: Self-pay

## 2021-08-24 ENCOUNTER — Other Ambulatory Visit: Payer: Self-pay

## 2021-11-05 ENCOUNTER — Ambulatory Visit: Payer: Medicare Other | Attending: INTERNAL MEDICINE | Admitting: INTERNAL MEDICINE

## 2021-11-05 ENCOUNTER — Encounter (RURAL_HEALTH_CENTER): Payer: Self-pay | Admitting: INTERNAL MEDICINE

## 2021-11-05 ENCOUNTER — Other Ambulatory Visit: Payer: Self-pay

## 2021-11-05 ENCOUNTER — Ambulatory Visit (RURAL_HEALTH_CENTER): Payer: Medicare Other | Attending: INTERNAL MEDICINE | Admitting: INTERNAL MEDICINE

## 2021-11-05 VITALS — BP 141/73 | HR 75 | Resp 16 | Ht 65.0 in | Wt 139.0 lb

## 2021-11-05 DIAGNOSIS — K529 Noninfective gastroenteritis and colitis, unspecified: Secondary | ICD-10-CM | POA: Insufficient documentation

## 2021-11-05 DIAGNOSIS — K7689 Other specified diseases of liver: Secondary | ICD-10-CM | POA: Insufficient documentation

## 2021-11-05 DIAGNOSIS — G4730 Sleep apnea, unspecified: Secondary | ICD-10-CM | POA: Insufficient documentation

## 2021-11-05 DIAGNOSIS — Z0000 Encounter for general adult medical examination without abnormal findings: Secondary | ICD-10-CM | POA: Insufficient documentation

## 2021-11-05 DIAGNOSIS — F3341 Major depressive disorder, recurrent, in partial remission: Secondary | ICD-10-CM | POA: Insufficient documentation

## 2021-11-05 DIAGNOSIS — Z7189 Other specified counseling: Secondary | ICD-10-CM | POA: Insufficient documentation

## 2021-11-05 DIAGNOSIS — G35 Multiple sclerosis: Secondary | ICD-10-CM | POA: Insufficient documentation

## 2021-11-05 DIAGNOSIS — G40909 Epilepsy, unspecified, not intractable, without status epilepticus: Secondary | ICD-10-CM | POA: Insufficient documentation

## 2021-11-05 DIAGNOSIS — D509 Iron deficiency anemia, unspecified: Secondary | ICD-10-CM | POA: Insufficient documentation

## 2021-11-05 DIAGNOSIS — R32 Unspecified urinary incontinence: Secondary | ICD-10-CM | POA: Insufficient documentation

## 2021-11-05 LAB — LIPID PANEL
CHOL/HDL RATIO: 3.9
CHOLESTEROL: 202 mg/dL — ABNORMAL HIGH (ref <200)
HDL CHOL: 52 mg/dL (ref 23–92)
LDL CALC: 136 mg/dL — ABNORMAL HIGH (ref 0–100)
TRIGLYCERIDES: 72 mg/dL (ref <=150)
VLDL CALC: 14 mg/dL (ref 0–50)

## 2021-11-05 LAB — COMPREHENSIVE METABOLIC PNL, FASTING
ALBUMIN/GLOBULIN RATIO: 1.5 — ABNORMAL HIGH (ref 0.8–1.4)
ALBUMIN: 4.3 g/dL (ref 3.5–5.7)
ALKALINE PHOSPHATASE: 113 U/L — ABNORMAL HIGH (ref 34–104)
ALT (SGPT): 11 U/L (ref 7–52)
ANION GAP: 5 mmol/L (ref 4–13)
AST (SGOT): 17 U/L (ref 13–39)
BILIRUBIN TOTAL: 0.3 mg/dL (ref 0.3–1.2)
BUN/CREA RATIO: 6 (ref 6–22)
BUN: 5 mg/dL — ABNORMAL LOW (ref 7–25)
CALCIUM, CORRECTED: 8.8 mg/dL — ABNORMAL LOW (ref 8.9–10.8)
CALCIUM: 9.1 mg/dL (ref 8.6–10.3)
CHLORIDE: 102 mmol/L (ref 98–107)
CO2 TOTAL: 32 mmol/L — ABNORMAL HIGH (ref 21–31)
CREATININE: 0.87 mg/dL (ref 0.60–1.30)
ESTIMATED GFR: 81 mL/min/{1.73_m2} (ref >59)
GLOBULIN: 2.9 (ref 2.9–5.4)
GLUCOSE: 95 mg/dL (ref 74–109)
OSMOLALITY, CALCULATED: 275 mOsm/kg (ref 270–290)
POTASSIUM: 3.5 mmol/L (ref 3.5–5.1)
PROTEIN TOTAL: 7.2 g/dL (ref 6.4–8.9)
SODIUM: 139 mmol/L (ref 136–145)

## 2021-11-05 LAB — CBC WITH DIFF
BASOPHIL #: 0 10*3/uL (ref 0.00–0.30)
BASOPHIL %: 0 % (ref 0–3)
EOSINOPHIL #: 0 10*3/uL (ref 0.00–0.80)
EOSINOPHIL %: 1 % (ref 0–7)
HCT: 36.2 % — ABNORMAL LOW (ref 37.0–47.0)
HGB: 12.1 g/dL — ABNORMAL LOW (ref 12.5–16.0)
LYMPHOCYTE #: 1.1 10*3/uL (ref 1.10–5.00)
LYMPHOCYTE %: 30 % (ref 25–45)
MCH: 27.4 pg (ref 27.0–32.0)
MCHC: 33.3 g/dL (ref 32.0–36.0)
MCV: 82.3 fL (ref 78.0–99.0)
MONOCYTE #: 0.3 10*3/uL (ref 0.00–1.30)
MONOCYTE %: 8 % (ref 0–12)
MPV: 9.1 fL (ref 7.4–10.4)
NEUTROPHIL #: 2.1 10*3/uL (ref 1.80–8.40)
NEUTROPHIL %: 61 % (ref 40–76)
PLATELETS: 247 10*3/uL (ref 140–440)
RBC: 4.4 10*6/uL (ref 4.20–5.40)
RDW: 14.5 % (ref 11.6–14.8)
WBC: 3.5 10*3/uL — ABNORMAL LOW (ref 4.0–10.5)
WBCS UNCORRECTED: 3.5 10*3/uL

## 2021-11-05 LAB — IRON TRANSFERRIN AND TIBC
IRON (TRANSFERRIN) SATURATION: 18 % (ref 15–50)
IRON: 47 ug/dL — ABNORMAL LOW (ref 50–212)
TOTAL IRON BINDING CAPACITY: 265 ug/dL (ref 250–450)
TRANSFERRIN: 189 mg/dL — ABNORMAL LOW (ref 203–362)
UIBC: 218 ug/dL (ref 130–375)

## 2021-11-05 LAB — FERRITIN: FERRITIN: 22 ng/mL (ref 11–336)

## 2021-11-05 MED ORDER — OXYBUTYNIN CHLORIDE 5 MG TABLET
5.0000 mg | ORAL_TABLET | Freq: Every day | ORAL | 1 refills | Status: DC
Start: 2021-11-05 — End: 2022-03-18

## 2021-11-05 MED ORDER — FLUOXETINE 40 MG CAPSULE
40.0000 mg | ORAL_CAPSULE | Freq: Every day | ORAL | 1 refills | Status: DC
Start: 2021-11-05 — End: 2022-03-18

## 2021-11-05 MED ORDER — FERROUS SULFATE 325 MG (65 MG IRON) TABLET
325.0000 mg | ORAL_TABLET | Freq: Every day | ORAL | 1 refills | Status: DC
Start: 2021-11-05 — End: 2022-03-18

## 2021-11-05 MED ORDER — CHOLESTYRAMINE (WITH SUGAR) 4 GRAM POWDER FOR SUSP IN A PACKET
4.0000 g | Freq: Two times a day (BID) | ORAL | 1 refills | Status: DC
Start: 2021-11-05 — End: 2022-03-18

## 2021-11-05 MED ORDER — ERGOCALCIFEROL (VITAMIN D2) 1,250 MCG (50,000 UNIT) CAPSULE
50000.0000 [IU] | ORAL_CAPSULE | ORAL | 1 refills | Status: DC
Start: 2021-11-05 — End: 2022-03-18

## 2021-11-05 NOTE — Nursing Note (Signed)
Patient is here for her follow up. Patient reports no new issues.

## 2021-11-05 NOTE — Progress Notes (Signed)
[facility]  [address]  [place], [place]  [address]  Phone: [phone] Fax: [phone]    Name: Carol Barajas                       Date of Birth: 08/07/1970   MRN:  [id number]                         Date of visit: 11/05/2021     Chief Complaint: Follow Up 6 Months (No new problems)    Current Outpatient Medications   Medication Sig    cholestyramine-sucrose (QUESTRAN) 4 gram Oral Powder in Packet Take 1 Packet by mouth Twice daily for 180 days    ergocalciferol, vitamin D2, (DRISDOL) 1,250 mcg (50,000 unit) Oral Capsule Take 1 Capsule (50,000 Units total) by mouth Every 7 days for 180 days    ferrous sulfate (FEOSOL) 325 mg (65 mg iron) Oral Tablet Take 1 Tablet (325 mg total) by mouth Once a day    FLUoxetine (PROZAC) 40 mg Oral Capsule Take 1 Capsule (40 mg total) by mouth Once a day for 180 days    KESIMPTA PEN 20 mg/0.4 mL Subcutaneous Pen Injector Inject 0.4 mL (20 mg total) under the skin Every 30 days    OXcarbazepine (TRILEPTAL) 600 mg Oral Tablet Take 1 Tablet (600 mg total) by mouth Twice daily    oxybutynin (DITROPAN) 5 mg Oral Tablet Take 1 Tablet (5 mg total) by mouth Once a day for 180 days       Patient Active Problem List    Diagnosis Date Noted    Urinary incontinence 08/02/2021    Hepatic cyst 08/02/2021    Major depression, recurrent (CMS HCC) 08/02/2021    Depression 01/14/2011    Iron deficiency anemia 01/14/2011    Multiple sclerosis (CMS HCC) 01/14/2011    Sleep apnea 01/14/2011    Seizure disorder (CMS HCC) 01/13/2011    Chronic diarrhea of unknown origin 05/25/2009     Formatting of this note might be different from the original.  Gastroenteritis         Subjective:   Patient is here for CDM visit.    Had eye exam and received new glasses.     Is having issues with memory loss. 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     Hepatic Cysts  Originally identified on extended imaging performed by Dr  Andee Poles  03-05-21 Multiple hepatic cysts are identified.  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    Multiple sclerosis  Currently on Kesimpta  Patient was informed in March 2020 that I was not comfortable treating her MS.  Largely due to her continued deterioration and most likely need for medication adjustment in the near future.  Patient was supposed to call back with name of neurology she would like to be referred to, but she did not do so  Was following with Dr.Paudyal at his departure.  The nurse practitioners at this clinic resumed her medication, but had not really performing any monitoring.  Symptoms are gradually getting worse per patient and available documentation.  Had not seen a neurologist since August 2016 from any record I can find.  10-2018 Referral placed to Dr Marta Antu. Apt scheduled for 11-25-2018.   Scheduled with [facility]. Scheduled for 02-09-2019.  01-2019 Patient called crying. Notes her apt with neurology was rescheduled. She will be  out of medication.   *At appointment today we called Dr. Cleophus Molt office.  Secretary did note that patient was scheduled and had appointment canceled.  Patient had called and informed that she had just gone to a large wedding.  No masks or social distancing.  They did not feel safe bringing patient into office, but did offer to reschedule.  Patient refused.  02-2019 MRI There is abnormal signal predominately involving the inferomedial left temporal lobe with atrophic changes.  On T2 coronal, there is cortical thinning and subcortical increased T2 signal.  The abnormal white matter signal extends posteriorly to a point adjacent to the left occipital horn, similar to previous.  This is unchanged to 2015.  04-2019 Now following with Dr Andee Poles in [place]  10-2019 No changes  04-2020 Noting more gradual weakness in legs, especially right. Now using cane occasionally. Not using today. No changes in medication, but they are recommending Brand Only  medication.  [phone] Continuing to have gradual weakness.  01-2021 Patient given steroids. Doing much better.  04-2021 Now on Kesimpta. Notes more trouble with gait.  07-2021 Still having a lot of trouble with gait.   [phone] Gait back to baseline    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  Currently on oxcarbazepine at maximum dose.  10-2018 Referral placed to Dr Marta Antu. Apt scheduled for 11-25-2018.   * Was changed to [facility]. Scheduled for 02-09-2019.  01-2019 Patient called crying. Notes her apt with neurology was rescheduled. She will be out of medication.  04-2019 Now following with Dr Andee Poles in [place].  [phone] No Changes  04-2020 No changes. They are recommending she be on Brand Only medication.  01-2021 no changes    Iron deficiency anemia  On replacement  Labs on 01-10-2019 showed hemoglobin 12.3  Labs on 10-17-2019 showed hemoglobin 12.2  Labs on 10-22-2020 showed hemoglobin 12.1    Chronic depression  Doing well with Prozac    Urinary Incontinence  Doing well with oxybutynin  We did not originally start this medication  Some risk of crossing BBB, and worsening neuro symptoms    Recurrent Diarrhea  Ongoing since at least 2011 per records  Patient thinks it may have something to do with milk, but tends to end up eating/drinking dairy products anyway  Have offered to refer patient to GI, but she refuses  Patient was prescribed Cholestyramine, which works well when she takes  CT 03-2021 was negative for causative etiology, but did not hepatic cysts    ROS:  10 systems reviewed and were negative except as noted.   + fatigue  + abnormal gait, back pain, joint pain, muscle weakness  + seizures  + anxiety, depression    Objective :  BP (!) 141/73 (Site: Left, Patient Position: Sitting, Cuff Size: Adult)   Pulse 75   Resp 16   Ht 1.651 m (5\' 5" )   Wt 63 kg (139 lb)   SpO2 99%   BMI 23.13 kg/m       General:  appears in good health  Lungs:   Clear to auscultation bilaterally.   Cardiovascular:  regular rate and rhythm  Neurologic:  Grossly normal  Psychiatric:  Normal    Data reviewed:      Assessment/Plan:  Assessment/Plan   1. Iron deficiency anemia    2. Chronic diarrhea of unknown origin    3. Recurrent major depressive disorder, in partial remission (CMS HCC)    4.  Urinary incontinence    5. Seizure disorder (CMS HCC)    6. Multiple sclerosis (CMS HCC)    7. Sleep apnea, unspecified type    8. General medical examination        Multiple sclerosis  Follow with Neuro.     Seizure disorder  Follow with Neuro.    Iron deficiency anemia  Continue replacement    Depression  Continue Prozac    Hepatic Cysts  Monitor    Recurrent Diarrhea  Avoid dairy  Use Lactaid or similar when cannot avoid  Use Cholestyramine to help with symptom control  Can refer to GI for further evaluation    Counseling provided on use of glasses and bifocals.     Terald Sleeper, DO, [facility]  Internal Medicine

## 2021-11-06 ENCOUNTER — Telehealth (RURAL_HEALTH_CENTER): Payer: Self-pay | Admitting: INTERNAL MEDICINE

## 2021-11-06 NOTE — Telephone Encounter (Signed)
Pt was notified.  

## 2021-11-06 NOTE — Telephone Encounter (Signed)
-----   Message from Terald Sleeper, DO sent at 11/06/2021  8:52 AM EDT -----  Labs look ok  I did check a cholesterol level since she had turned 51  Its just a little out of range, so try to watch fatty foods

## 2021-12-20 ENCOUNTER — Other Ambulatory Visit (HOSPITAL_COMMUNITY): Payer: Self-pay | Admitting: PSYCHIATRY AND NEUROLOGY-NEUROLOGY

## 2021-12-20 DIAGNOSIS — G35 Multiple sclerosis: Secondary | ICD-10-CM

## 2021-12-31 ENCOUNTER — Ambulatory Visit (HOSPITAL_COMMUNITY): Payer: Self-pay

## 2022-01-06 ENCOUNTER — Inpatient Hospital Stay (HOSPITAL_COMMUNITY)
Admission: RE | Admit: 2022-01-06 | Discharge: 2022-01-06 | Disposition: A | Discharge status: Home / Self Care | Payer: Medicare Other | Source: Ambulatory Visit | Attending: PSYCHIATRY AND NEUROLOGY-NEUROLOGY | Admitting: PSYCHIATRY AND NEUROLOGY-NEUROLOGY

## 2022-01-06 ENCOUNTER — Inpatient Hospital Stay
Admission: RE | Admit: 2022-01-06 | Discharge: 2022-01-06 | Disposition: A | Discharge status: Home / Self Care | Payer: Medicare Other | Source: Ambulatory Visit | Attending: PSYCHIATRY AND NEUROLOGY-NEUROLOGY | Admitting: PSYCHIATRY AND NEUROLOGY-NEUROLOGY

## 2022-01-06 ENCOUNTER — Ambulatory Visit (HOSPITAL_COMMUNITY): Payer: Self-pay

## 2022-01-06 ENCOUNTER — Other Ambulatory Visit: Payer: Self-pay

## 2022-01-06 DIAGNOSIS — G35 Multiple sclerosis: Secondary | ICD-10-CM

## 2022-01-07 ENCOUNTER — Ambulatory Visit (HOSPITAL_COMMUNITY): Payer: Self-pay

## 2022-02-07 ENCOUNTER — Ambulatory Visit (RURAL_HEALTH_CENTER): Payer: Self-pay | Admitting: INTERNAL MEDICINE

## 2022-03-18 ENCOUNTER — Encounter (RURAL_HEALTH_CENTER): Payer: Self-pay | Admitting: INTERNAL MEDICINE

## 2022-03-18 ENCOUNTER — Other Ambulatory Visit: Payer: Self-pay

## 2022-03-18 ENCOUNTER — Ambulatory Visit (RURAL_HEALTH_CENTER): Payer: Medicare Other | Attending: INTERNAL MEDICINE | Admitting: INTERNAL MEDICINE

## 2022-03-18 ENCOUNTER — Ambulatory Visit (RURAL_HEALTH_CENTER): Payer: Self-pay | Admitting: INTERNAL MEDICINE

## 2022-03-18 DIAGNOSIS — Z1231 Encounter for screening mammogram for malignant neoplasm of breast: Secondary | ICD-10-CM

## 2022-03-18 DIAGNOSIS — G40909 Epilepsy, unspecified, not intractable, without status epilepticus: Secondary | ICD-10-CM | POA: Insufficient documentation

## 2022-03-18 DIAGNOSIS — K7689 Other specified diseases of liver: Secondary | ICD-10-CM

## 2022-03-18 DIAGNOSIS — F3341 Major depressive disorder, recurrent, in partial remission: Secondary | ICD-10-CM | POA: Insufficient documentation

## 2022-03-18 DIAGNOSIS — G35 Multiple sclerosis: Secondary | ICD-10-CM | POA: Insufficient documentation

## 2022-03-18 DIAGNOSIS — G4730 Sleep apnea, unspecified: Secondary | ICD-10-CM | POA: Insufficient documentation

## 2022-03-18 DIAGNOSIS — K529 Noninfective gastroenteritis and colitis, unspecified: Secondary | ICD-10-CM | POA: Insufficient documentation

## 2022-03-18 DIAGNOSIS — Z79899 Other long term (current) drug therapy: Secondary | ICD-10-CM | POA: Insufficient documentation

## 2022-03-18 DIAGNOSIS — Z0000 Encounter for general adult medical examination without abnormal findings: Secondary | ICD-10-CM | POA: Insufficient documentation

## 2022-03-18 DIAGNOSIS — R32 Unspecified urinary incontinence: Secondary | ICD-10-CM | POA: Insufficient documentation

## 2022-03-18 DIAGNOSIS — D509 Iron deficiency anemia, unspecified: Secondary | ICD-10-CM | POA: Insufficient documentation

## 2022-03-18 MED ORDER — FERROUS SULFATE 325 MG (65 MG IRON) TABLET
325.0000 mg | ORAL_TABLET | Freq: Every day | ORAL | 1 refills | Status: DC
Start: 2022-03-18 — End: 2022-06-26

## 2022-03-18 MED ORDER — FLUOXETINE 40 MG CAPSULE
40.0000 mg | ORAL_CAPSULE | Freq: Every day | ORAL | 1 refills | Status: DC
Start: 2022-03-18 — End: 2022-06-26

## 2022-03-18 MED ORDER — CHOLESTYRAMINE (WITH SUGAR) 4 GRAM POWDER FOR SUSP IN A PACKET
4.0000 g | Freq: Two times a day (BID) | ORAL | 1 refills | Status: DC
Start: 2022-03-18 — End: 2022-06-26

## 2022-03-18 MED ORDER — ERGOCALCIFEROL (VITAMIN D2) 1,250 MCG (50,000 UNIT) CAPSULE
50000.0000 [IU] | ORAL_CAPSULE | ORAL | 1 refills | Status: DC
Start: 2022-03-18 — End: 2022-06-26

## 2022-03-18 MED ORDER — OXYBUTYNIN CHLORIDE 5 MG TABLET
5.0000 mg | ORAL_TABLET | Freq: Every day | ORAL | 1 refills | Status: DC
Start: 2022-03-18 — End: 2022-06-26

## 2022-03-18 NOTE — Patient Instructions (Signed)
Medicare Preventive Services  Medicare coverage information Recommendation for YOU   Heart Disease and Diabetes   Lipid profile Every 5 years or more often if at risk for cardiovascular disease  Last Lipid Panel  (Last result in the past 2 years)        Cholesterol   HDL   LDL   Direct LDL   Triglycerides      11/05/21 1138 202   52   136     72             Diabetes Screening  yearly for those at risk for diabetes, 2 tests per year for those with prediabetes Last Glucose: 95    Diabetes Self Management Training or Medical Nutrition Therapy  For those with diabetes, up to 10 hrs initial training within a year, subsequent years up to 2 hrs of follow up training Optional for those with diabetes     Medical Nutrition Therapy Three hours of one-on-one counseling in first year, two hours in subsequent years Optional for those with diabetes, kidney disease   Intensive Behavioral Therapy for Obesity  Face-to-face counseling, first month every week, month 2-6 every other week, month 7-12 every month if continued progress is documented Optional for those with Body Mass Index 30 or higher  Your There is no height or weight on file to calculate BMI.   Tobacco Cessation (Quitting) Counseling   Two attempts per year, max 4 sessions per attempt, up to 8 per year, for those with tobacco-related health condition Optional for those that use tobacco   Cancer Screening   Colorectal screening   For anyone age 54 to 71 or any age if high risk:  Screening Colonoscopy every 10 yrs if low risk,  more frequent if higher risk  OR  Cologuard Stool DNA test once every 3 years OR  Fecal Occult Blood Testing yearly OR  Flexible  Sigmoidoscopy  every 5 yr OR  CT Colonography every 5 yrs    See your schedule below   Screening Pap Test Recommended every 3 years for all women age 16 to 67, or every five years if combined with HPV test (routine screening not needed after total hysterectomy).  Medicare covers every 2 years, up to yearly if high  risk.  Screening Pelvic Exam Medicare covers every 2 years, yearly if high risk or childbearing age with abnormal Pap in last 3 yrs. See your schedule below   Screening Mammogram   Recommended every 2 years for women age 18 to 26, or more frequent if you have a higher risk. Selectively recommended for women between 40-49 based on shared decisions about risk. Covered by Medicare up to every year for women age 28 or older See your schedule below      Lung Cancer Screening  Annual low dose computed tomography (LDCT scan) is recommended for those age 1-77 who smoked 20 pack-years and are current smokers or quit smoking within past 15 years (one pack-year= smoking one PPD for one year), after counseling by your doctor or nurse clinician about the possible benefits or harms. See your schedule below   Vaccinations   Pneumococcal Vaccine: Recommended routinely age 83+ with one or two separate vaccines based on your risk    Recommended before age 19 if medical conditions with increased risk  Seasonal Influenza Vaccine: Once every flu season   Hepatitis B Vaccine: 3 doses if risk (including anyone with diabetes or liver disease)  Shingles Vaccine: Two doses at  age 75 or older  Diphtheria Tetanus Pertussis Vaccine: ONCE as adult, booster every 10 years     There is no immunization history on file for this patient.  Shingles vaccine and Diphtheria Tetanus Pertussis vaccines are available at pharmacies or local health department without a prescription.   Other Screening   Bone Densitometry   Every 24 months for anyone at risk, including postmenopausal       Glaucoma Screening   Yearly if in high risk group such as diabetes, family history, African American age 33+ or Hispanic American age 79+   See your Eye Care Provider   Hepatitis C Screening recommended ONCE for those born between 1945-1965, or high risk for HCV infection       HIV Testing recommended routinely at least ONCE, covered every year for age 36 to 67 regardless of  risk, and every year for age over 41 who ask for the test or higher risk  Yearly or up to 3 times in pregnancy         Abdominal Aortic Aneurysm Screening Ultrasound   Once between the age of 45-75 with a family history of AAA       Your Personalized Schedule for Preventive Tests     Health Maintenance: Pending and Last Completed         Date Due Completion Date    HIV Screening Never done ---    Pap smear Never done ---    Mammography Never done ---    Influenza Vaccine (1) 04/17/2022 (Originally 11/01/2021) ---    Covid-19 Vaccine (1) 06/16/2022 (Originally 08/18/1976) ---    Colonoscopy 09/16/2022 (Originally 08/18/2016) ---    Adult Tdap-Td (1 - Tdap) 03/19/2023 (Originally 08/19/1990) ---    Hepatitis B Vaccine (1 of 3 - 3-dose series) 03/19/2023 (Originally December 13, 1970) ---    Shingles Vaccine (1 of 2) 03/19/2023 (Originally 08/19/1990) ---    Pneumococcal Vaccine, Age 15-64 (1 - PCV) 03/19/2023 (Originally 08/18/1977) ---    Medicare Annual Wellness Visit 03/19/2023 03/18/2022

## 2022-03-18 NOTE — Nursing Note (Signed)
03/18/22 [place]   Do you wish to complete this form? Yes   During the past 4 weeks, how would you rate your health in general? Fair   During the past 4 weeks, how much difficulty have you had doing your usual activities inside and outside your home because of medical or emotional problems? Some difficulty   During the past 4 weeks, was someone available to help you if you needed and wanted help? Yes, as much as I wanted   In the past year, how many times have you gone to the emergency department or been admitted to a hospital for a health problem? None   Are you generally satisfied with your sleep? Yes   Do you have enough money to buy things you need in everyday life, such as food, clothing, medicines, and housing? Yes, always   Can you get to places beyond walking distance without help?  (For example, can you drive your own car or travel alone on buses)? No   Do you fasten your seatbelt when you are in a car? Yes, usually   Do you exercise 20 minutes 3 or more days per week (such as walking, dancing, biking, mowing grass, swimming)? No, I usually don't exercise this much   How often do you eat food that is healthy (fruits, vegetables, lean meats) instead of unhealthy (sweets, fast food, junk food, fatty foods)? Most of the time   Have your parents, brothers or sisters had any of the following problems before the age of 37? (check all that apply) Heart problems, or hardening of the arteries;Cancer;High cholesterol;Alcohol or drug addiction (or abuse)   How often do you have trouble taking medicines the eay you are told to take them? I always take them as prescribed   Do you need any help communicating with your doctors and nurses because of vision or hearing problems? Yes   During the past 12 months, have you experienced confusion or memory loss that is happening more often or is getting worse? Yes   Do you have one person you think of as your personal doctor (primary care  provider or family doctor)? Yes   If you are seeing a Primary Care Provider (PCP) or family doctor. please list their name [facility]   Are you now also seeing any specialist physician(s) (such as eye doctor, foot doctor, skin doctor)? Yes   If you are seeing a specialist for anything such as foot, eye, skin, etc.  please list their name(s) [facility]   How confident are you that you can control or manage most of your health problems? Somewhat confident

## 2022-03-18 NOTE — Nursing Note (Signed)
03/18/22 1030   Medicare Wellness Assessment   Medicare initial or wellness physical in the last year? No   Advance Directives   Does patient have a living will or MPOA no   Has patient provided [employer] with a copy? no   Advance directive information given to the patient today? no   Activities of Daily Living   Do you need help with dressing, bathing, or walking? No   Do you need help with shopping, housekeeping, medications, or finances? No   Do you have rugs in hallways, broken steps, or poor lighting? No   Do you have grab bars in your bathroom, non-slip strips in your tub, and hand rails on your stairs? No   Cognitive Function Screen   What is you age? 1   What is the time to the nearest hour? 0   What is the year? 1   What is the name of this clinic? 0   Can the patient recognize two persons (the doctor, the nurse, home help, etc.)? 1   What is the date of your birth? (day and month sufficient)  1   In what year did World War II end? 0   Who is the current president of the [place]? 0   Count from 20 down to 1? 1   What address did I give you earlier? 1   Total Score 6   Depression Screen   Little interest or pleasure in doing things. 0   Feeling down, depressed, or hopeless 0   PHQ 2 Total 0   Pain Score   Pain Score Zero   Substance Use Screening   In Past 12 MONTHS, how often have you used any tobacco product (for example, cigarettes, e-cigarettes, cigars, pipes, or smokeless tobacco)? Never   In the PAST 12 MONTHS, how often have you had 5 (men)/4 (women) or more drinks containing alcohol in one day? Never   In the PAST 12 months, how often have you used any prescription medications just for the feeling, more than prescribed, or that were not prescribed for you? Prescriptions may include: opioids, benzodiazepines, medications for ADHD Never   In the PAST 12 MONTHS, how often have you used any drugs, including marijuana, cocaine or crack, heroin, methamphetamine, hallucinogens, ecstasy/MDMA?  Never   Hearing Screen   Have you noticed any hearing difficulties? Yes   After whispering 9-1-6 how many numbers did the patient repeat correctly? 3   Fall Risk Assessment   Do you feel unsteady when standing or walking? Yes   Do you worry about falling? No   Have you fallen in the past year? Yes   How many times have you fallen? 2 or more times   Were you ever injured from falling? No   Timed up and go test (in seconds) 45

## 2022-03-18 NOTE — Progress Notes (Signed)
[facility]  [address]  [place], [place]  [address]  Phone: [phone] Fax: [phone]    Name: Carol Barajas                       Date of Birth: December 28, 1970   MRN:  [id number]                         Date of visit: 03/18/2022     Chief Complaint: Medicare Annual (Medicare wellness  and follow up)    Current Outpatient Medications   Medication Sig    cholestyramine-sucrose (QUESTRAN) 4 gram Oral Powder in Packet Take 1 Packet by mouth Twice daily for 180 days    ergocalciferol, vitamin D2, (DRISDOL) 1,250 mcg (50,000 unit) Oral Capsule Take 1 Capsule (50,000 Units total) by mouth Every 7 days for 180 days    ferrous sulfate (FEOSOL) 325 mg (65 mg iron) Oral Tablet Take 1 Tablet (325 mg total) by mouth Once a day for 180 days    FLUoxetine (PROZAC) 40 mg Oral Capsule Take 1 Capsule (40 mg total) by mouth Once a day for 180 days    KESIMPTA PEN 20 mg/0.4 mL Subcutaneous Pen Injector Inject 0.4 mL (20 mg total) under the skin Every 30 days    OXcarbazepine (TRILEPTAL) 600 mg Oral Tablet Take 1 Tablet (600 mg total) by mouth Twice daily    oxyBUTYnin (DITROPAN) 5 mg Oral Tablet Take 1 Tablet (5 mg total) by mouth Once a day for 180 days       Patient Active Problem List    Diagnosis Date Noted    Urinary incontinence 08/02/2021    Hepatic cyst 08/02/2021    Major depression, recurrent (CMS [place]) 08/02/2021    Depression 01/14/2011    Iron deficiency anemia 01/14/2011    Multiple sclerosis (CMS [place]) 01/14/2011    Sleep apnea 01/14/2011    Seizure disorder (CMS [place]) 01/13/2011    Chronic diarrhea of unknown origin 05/25/2009     Formatting of this note might be different from the original.  Gastroenteritis         Subjective:   Patient is here for CDM visit.    The patient/family initiated a request for telephone service.  Verbal consent for this service was obtained from the patient/family.    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    Multiple sclerosis  Currently on Kesimpta  Patient was informed in March 2020 that I was not comfortable treating her MS.  Largely due to her continued deterioration and most likely need for medication adjustment in the near future.  Patient was supposed to call back with name of neurology she would like to be referred to, but she did not do so  Was following with Dr.Paudyal at his departure.  The nurse practitioners at this clinic resumed her medication, but had not really performing any monitoring.  Symptoms are gradually getting worse per patient and available documentation.  Had not seen a neurologist since August 2016 from any record I can find.  10-2018 Referral placed to Dr Candelaria Stagers. Apt scheduled for 11-25-2018.   Scheduled with [facility]. Scheduled for 02-09-2019.  01-2019 Patient called crying. Notes her apt with neurology was rescheduled. She will be out of medication.   *At appointment today we called Dr. Pauline Aus office.  Secretary did note that patient was scheduled and had appointment canceled.  Patient had called and informed that she had just gone to a large wedding.  No masks or social distancing.  They did not feel safe bringing patient into office, but did offer to reschedule.  Patient refused.  02-2019 MRI There is abnormal signal predominately involving the inferomedial left temporal lobe with atrophic changes.  On T2 coronal, there is cortical thinning and subcortical increased T2 signal.  The abnormal white matter signal extends posteriorly to a point adjacent to the left occipital horn, similar to previous.  This is unchanged to 2015.  04-2019 Now following with Dr Pryor Ochoa in [place]  10-2019 No changes  04-2020 Noting more gradual weakness in legs, especially right. Now using cane occasionally. Not using today. No changes in medication, but they are recommending Brand Only medication.  10-2020 Continuing to have gradual  weakness.  01-2021 Patient given steroids. Doing much better.  04-2021 Now on Kesimpta. Notes more trouble with gait.  07-2021 Still having a lot of trouble with gait.   10-2021 Gait back to baseline  12-2021 MRI thoracic spine: Subtle foci of increased signal intensity in the lower thoracic spinal cord, unchanged from 01-11-2021. No new findings  12-2021 MRI brain: Periventricular white matter signal abnormalities unchanged from 01-11-2021. Abnormal signal intesnity in the left temporal lobe. Unchanged from 01-11-2021. No new findings.   03-2022 Having weakness after a seizure a few weeks ago. Dr Vaught's office suggested supportive device. No med changes.     Seizures  Sounds like absence type. Previously grand mal.  Currently on oxcarbazepine at maximum dose.  10-2018 Referral placed to Dr Candelaria Stagers. Apt scheduled for 11-25-2018.   * Was changed to [facility]. Scheduled for 02-09-2019.  01-2019 Patient called crying. Notes her apt with neurology was rescheduled. She will be out of medication.  04-2019 Now following with Dr Pryor Ochoa in [place].  [id number] No Changes  04-2020 No changes. They are recommending she be on Brand Only medication.  01-2021 no changes  03-2022 Had bad seizure. Now having weakness.     Iron deficiency anemia  On replacement  Labs on 01-10-2019 showed hemoglobin 12.3  Labs on 10-17-2019 showed hemoglobin 12.2  Labs on 10-22-2020 showed hemoglobin 12.1  Labs on 11/05/2021 showed hemoglobin 12.1, iron 47, transferrin 189, ferritin 22.  All other normal    Chronic depression  Doing well with Prozac    Urinary Incontinence  Doing well with oxybutynin  We did not originally start this medication  Some risk of crossing BBB, and worsening neuro symptoms    Recurrent Diarrhea  Ongoing since at least 2011 per records  Patient thinks it may have something to do with milk, but tends to end up eating/drinking dairy products anyway  Have offered to refer patient to GI, but she refuses  Patient was  prescribed Cholestyramine, which works well when she takes  CT 03-2021 was negative for causative etiology, but did not hepatic cysts    ROS:  10 systems reviewed and were negative except as noted.   + fatigue  + abnormal gait, back pain, joint pain, muscle weakness  + seizures  + anxiety, depression    Objective :  There were no vitals taken for this visit.      General:  appears in good health, comfortable  Psychiatric:  Normal    Data reviewed:  MRIs    Assessment/Plan:  Assessment/Plan   1. Chronic diarrhea of unknown origin  2. Hepatic cyst    3. Urinary incontinence    4. Sleep apnea, unspecified type    5. Iron deficiency anemia    6. Multiple sclerosis (CMS [facility])    7. Seizure disorder (CMS HCC)    8. Recurrent major depressive disorder, in partial remission (CMS HCC)    9. Breast cancer screening by mammogram          Multiple sclerosis  Follow with Neuro.     Seizure disorder  Follow with Neuro.    Iron deficiency anemia  Continue replacement    Depression  Continue Prozac    Hepatic Cysts  Monitor    Recurrent Diarrhea  Avoid dairy  Use Lactaid or similar when cannot avoid  Use Cholestyramine to help with symptom control  Can refer to GI for further evaluation    Comprehensive Health Assessment:  Patient verbal responses recorded in flowsheet       03/18/2022    10:00 AM   Comprehensive Health Assessment-Adult   Do you wish to complete this form? Yes   During the past 4 weeks, how would you rate your health in general? Fair   During the past 4 weeks, how much difficulty have you had doing your usual activities inside and outside your home because of medical or emotional problems? Some difficulty   During the past 4 weeks, was someone available to help you if you needed and wanted help? Yes, as much as I wanted   In the past year, how many times have you gone to the emergency department or been admitted to a hospital for a health problem? None   Are you generally satisfied with your sleep? Yes   Do you  have enough money to buy things you need in everyday life, such as food, clothing, medicines, and housing? Yes, always   Can you get to places beyond walking distance without help?  (For example, can you drive your own car or travel alone on buses)? No   Do you fasten your seatbelt when you are in a car? Yes, usually   Do you exercise 20 minutes 3 or more days per week (such as walking, dancing, biking, mowing grass, swimming)? No, I usually don't exercise this much   How often do you eat food that is healthy (fruits, vegetables, lean meats) instead of unhealthy (sweets, fast food, junk food, fatty foods)? Most of the time   Have your parents, brothers or sisters had any of the following problems before the age of 1? (check all that apply) Heart problems, or hardening of the arteries;Cancer;High cholesterol;Alcohol or drug addiction (or abuse)   How often do you have trouble taking medicines the eay you are told to take them? I always take them as prescribed   Do you need any help communicating with your doctors and nurses because of vision or hearing problems? Yes   During the past 12 months, have you experienced confusion or memory loss that is happening more often or is getting worse? Yes   Do you have one person you think of as your personal doctor (primary care provider or family doctor)? Yes   If you are seeing a Primary Care Provider (PCP) or family doctor. please list their name Deneene Tarver   Are you now also seeing any specialist physician(s) (such as eye doctor, foot doctor, skin doctor)? Yes   If you are seeing a specialist for anything such as foot, eye, skin, etc.  please list their name(s) [facility]  How confident are you that you can control or manage most of your health problems? Somewhat confident       I have reviewed and updated as appropriate the past medical, family and social history. 03/18/2022 as summarized below:  Past Medical History:   Diagnosis Date    Anxiety     Convulsions (CMS [place])      Multiple sclerosis (CMS [place])      History reviewed. No pertinent surgical history.  Current Outpatient Medications   Medication Sig    cholestyramine-sucrose (QUESTRAN) 4 gram Oral Powder in Packet Take 1 Packet by mouth Twice daily for 180 days    ergocalciferol, vitamin D2, (DRISDOL) 1,250 mcg (50,000 unit) Oral Capsule Take 1 Capsule (50,000 Units total) by mouth Every 7 days for 180 days    ferrous sulfate (FEOSOL) 325 mg (65 mg iron) Oral Tablet Take 1 Tablet (325 mg total) by mouth Once a day for 180 days    FLUoxetine (PROZAC) 40 mg Oral Capsule Take 1 Capsule (40 mg total) by mouth Once a day for 180 days    KESIMPTA PEN 20 mg/0.4 mL Subcutaneous Pen Injector Inject 0.4 mL (20 mg total) under the skin Every 30 days    OXcarbazepine (TRILEPTAL) 600 mg Oral Tablet Take 1 Tablet (600 mg total) by mouth Twice daily    oxyBUTYnin (DITROPAN) 5 mg Oral Tablet Take 1 Tablet (5 mg total) by mouth Once a day for 180 days     Family Medical History:       Problem Relation (Age of Onset)    Primary Brain tumor Brother            Social History     Socioeconomic History    Marital status: Divorced    Number of children: 1   Tobacco Use    Smoking status: Never    Smokeless tobacco: Never   Vaping Use    Vaping Use: Never used   Substance and Sexual Activity    Alcohol use: Never    Drug use: Never     Social Determinants of Health     Health Literacy: Low Risk  (11/05/2021)    Health Literacy     SDOH Health Literacy: Never         List of Current Health Care Providers   Care Team       PCP       Name Type Specialty Phone Number    Casimer Lanius, DO Physician INTERNAL MEDICINE [phone]              Care Team       Name Type Specialty Phone Number    Manon Hilding, MD Not available PSYCHIATRY AND NEUROLOGY-NEUROLOGY [phone]                      Health Maintenance   Topic Date Due    HIV Screening  Never done    Pap smear  Never done    Mammography  Never done    Influenza Vaccine (1) 04/17/2022 (Originally  11/01/2021)    Covid-19 Vaccine (1) 06/16/2022 (Originally 08/18/1976)    Colonoscopy  09/16/2022 (Originally 08/18/2016)    Adult Tdap-Td (1 - Tdap) 03/19/2023 (Originally 08/19/1990)    Hepatitis B Vaccine (1 of 3 - 3-dose series) 03/19/2023 (Originally October 22, 1970)    Shingles Vaccine (1 of 2) 03/19/2023 (Originally 08/19/1990)    Pneumococcal Vaccine, Age 72-64 (1 - PCV) 03/19/2023 (Originally 08/18/1977)  Medicare Annual Wellness Visit  03/19/2023    Meningococcal Vaccine  Aged Out    Depression Screening  Discontinued     Medicare Wellness Assessment   Medicare initial or wellness physical in the last year?: No  Advance Directives   Does patient have a living will or MPOA: no   Has patient provided [employer] with a copy?: no   Advance directive information given to the patient today?: no      Activities of Daily Living   Do you need help with dressing, bathing, or walking?: No   Do you need help with shopping, housekeeping, medications, or finances?: No   Do you have rugs in hallways, broken steps, or poor lighting?: No   Do you have grab bars in your bathroom, non-slip strips in your tub, and hand rails on your stairs?: No   Urinary Incontinence Screen       Cognitive Function Screen (1=Yes, 0=No)   What is you age?: Correct   What is the time to the nearest hour?: Incorrect   What is the year?: Correct   What is the name of this clinic?: Incorrect   Can the patient recognize two persons (the doctor, the nurse, home help, etc.)?: Correct   What is the date of your birth? (day and month sufficient) : Correct   In what year did World War II end?: Incorrect   Who is the current president of the [place]?: Incorrect   Count from 20 down to 1?: Correct   What address did I give you earlier?: Correct   Total Score: 6       Hearing Screen   Have you noticed any hearing difficulties?: Yes  After whispering 9-1-6 how many numbers did the patient repeat correctly?: 3   Fall Risk Screen   Do you feel unsteady when  standing or walking?: Yes  Do you worry about falling?: No  Have you fallen in the past year?: Yes  How many times have you fallen?: 2 or more times  Were you ever injured from falling?: No  Timed up and go test (in seconds): 45   Vision Screen           Depression Screen     Little interest or pleasure in doing things.: Not at all  Feeling down, depressed, or hopeless: Not at all  PHQ 2 Total: 0     Pain Score   Pain Score:   0 - No pain    Substance Use-Abuse Screening     Tobacco Use     In Past 12 MONTHS, how often have you used any tobacco product (for example, cigarettes, e-cigarettes, cigars, pipes, or smokeless tobacco)?: Never     Alcohol use     In the PAST 12 MONTHS, how often have you had 5 (men)/4 (women) or more drinks containing alcohol in one day?: Never     Prescription Drug Use     In the PAST 12 months, how often have you used any prescription medications just for the feeling, more than prescribed, or that were not prescribed for you? Prescriptions may include: opioids, benzodiazepines, medications for ADHD: Never           Illicit Drug Use   In the PAST 12 MONTHS, how often have you used any drugs, including marijuana, cocaine or crack, heroin, methamphetamine, hallucinogens, ecstasy/MDMA?: Never           OBJECTIVE:   There were no vitals taken for this  visit.       Other appropriate exam:    Health Maintenance Due   Topic Date Due    HIV Screening  Never done    Pap smear  Never done    Mammography  Never done      ASSESSMENT & PLAN:   Assessment/Plan   1. Chronic diarrhea of unknown origin    2. Hepatic cyst    3. Urinary incontinence    4. Sleep apnea, unspecified type    5. Iron deficiency anemia    6. Multiple sclerosis (CMS HCC)    7. Seizure disorder (CMS HCC)    8. Recurrent major depressive disorder, in partial remission (CMS HCC)    9. Breast cancer screening by mammogram       Identified Risk Factors/ Recommended Actions                 Orders Placed This Encounter    MAMMO BILATERAL  SCREENING-ADDL VIEWS/BREAST US AS REQ BY RAD    cholestyramine-sucrose (QUESTRAN) 4 gram Oral Powder in Packet    ergocalciferol, vitamin D2, (DRISDOL) 1,250 mcg (50,000 unit) Oral Capsule    ferrous sulfate (FEOSOL) 325 mg (65 mg iron) Oral Tablet    FLUoxetine (PROZAC) 40 mg Oral Capsule    oxyBUTYnin (DITROPAN) 5 mg Oral Tablet          The patient has been educated about risk factors and recommended preventive care. Written Prevention Plan completed/ updated and given to patient (see After Visit Summary).    Return in about 3 months (around 06/17/2022).    Telehealth visit. Audio only. 30 minutes.     Will get labs next face-to-face.     Terald Sleeper, DO, [facility]  Internal Medicine

## 2022-03-18 NOTE — Nursing Note (Signed)
Patient is for her Medicare wellness and follow up. Patient reports a small seizure two weeks ago but did not have to seek medical attention.

## 2022-04-14 ENCOUNTER — Ambulatory Visit (HOSPITAL_COMMUNITY): Payer: Self-pay

## 2022-05-07 ENCOUNTER — Ambulatory Visit (HOSPITAL_COMMUNITY): Payer: Self-pay

## 2022-05-13 ENCOUNTER — Ambulatory Visit (HOSPITAL_COMMUNITY): Payer: Self-pay

## 2022-05-15 ENCOUNTER — Other Ambulatory Visit: Payer: Self-pay

## 2022-05-15 ENCOUNTER — Encounter (HOSPITAL_COMMUNITY): Payer: Self-pay

## 2022-05-15 ENCOUNTER — Inpatient Hospital Stay
Admission: RE | Admit: 2022-05-15 | Discharge: 2022-05-15 | Disposition: A | Discharge status: Home / Self Care | Payer: Medicare (Managed Care) | Source: Ambulatory Visit | Attending: INTERNAL MEDICINE | Admitting: INTERNAL MEDICINE

## 2022-05-15 DIAGNOSIS — Z1231 Encounter for screening mammogram for malignant neoplasm of breast: Secondary | ICD-10-CM | POA: Insufficient documentation

## 2022-06-19 ENCOUNTER — Ambulatory Visit (RURAL_HEALTH_CENTER): Payer: Self-pay | Admitting: INTERNAL MEDICINE

## 2022-06-26 ENCOUNTER — Ambulatory Visit (RURAL_HEALTH_CENTER): Payer: Medicare (Managed Care) | Attending: INTERNAL MEDICINE | Admitting: INTERNAL MEDICINE

## 2022-06-26 ENCOUNTER — Encounter (RURAL_HEALTH_CENTER): Payer: Self-pay | Admitting: INTERNAL MEDICINE

## 2022-06-26 ENCOUNTER — Ambulatory Visit: Payer: Medicare (Managed Care) | Attending: INTERNAL MEDICINE | Admitting: INTERNAL MEDICINE

## 2022-06-26 ENCOUNTER — Other Ambulatory Visit: Payer: Self-pay

## 2022-06-26 VITALS — BP 151/78 | HR 72 | Resp 16 | Ht 65.0 in | Wt 136.1 lb

## 2022-06-26 DIAGNOSIS — R32 Unspecified urinary incontinence: Secondary | ICD-10-CM | POA: Insufficient documentation

## 2022-06-26 DIAGNOSIS — F3341 Major depressive disorder, recurrent, in partial remission: Secondary | ICD-10-CM | POA: Insufficient documentation

## 2022-06-26 DIAGNOSIS — K529 Noninfective gastroenteritis and colitis, unspecified: Secondary | ICD-10-CM | POA: Insufficient documentation

## 2022-06-26 DIAGNOSIS — G35 Multiple sclerosis: Secondary | ICD-10-CM | POA: Insufficient documentation

## 2022-06-26 DIAGNOSIS — G40909 Epilepsy, unspecified, not intractable, without status epilepticus: Secondary | ICD-10-CM | POA: Insufficient documentation

## 2022-06-26 DIAGNOSIS — G4730 Sleep apnea, unspecified: Secondary | ICD-10-CM | POA: Insufficient documentation

## 2022-06-26 DIAGNOSIS — D509 Iron deficiency anemia, unspecified: Secondary | ICD-10-CM | POA: Insufficient documentation

## 2022-06-26 DIAGNOSIS — K7689 Other specified diseases of liver: Secondary | ICD-10-CM | POA: Insufficient documentation

## 2022-06-26 LAB — COMPREHENSIVE METABOLIC PNL, FASTING
ALBUMIN/GLOBULIN RATIO: 1.5 — ABNORMAL HIGH (ref 0.8–1.4)
ALBUMIN: 4.4 g/dL (ref 3.5–5.7)
ALKALINE PHOSPHATASE: 98 U/L (ref 34–104)
ALT (SGPT): 13 U/L (ref 7–52)
ANION GAP: 5 mmol/L (ref 4–13)
AST (SGOT): 20 U/L (ref 13–39)
BILIRUBIN TOTAL: 0.2 mg/dL — ABNORMAL LOW (ref 0.3–1.2)
BUN/CREA RATIO: 14 (ref 6–22)
BUN: 10 mg/dL (ref 7–25)
CALCIUM, CORRECTED: 8.9 mg/dL (ref 8.9–10.8)
CALCIUM: 9.2 mg/dL (ref 8.6–10.3)
CHLORIDE: 102 mmol/L (ref 98–107)
CO2 TOTAL: 31 mmol/L (ref 21–31)
CREATININE: 0.74 mg/dL (ref 0.60–1.30)
ESTIMATED GFR: 99 mL/min/{1.73_m2} (ref >59)
GLOBULIN: 3 (ref 2.9–5.4)
GLUCOSE: 89 mg/dL (ref 74–109)
OSMOLALITY, CALCULATED: 274 mOsm/kg (ref 270–290)
POTASSIUM: 5.2 mmol/L — ABNORMAL HIGH (ref 3.5–5.1)
PROTEIN TOTAL: 7.4 g/dL (ref 6.4–8.9)
SODIUM: 138 mmol/L (ref 136–145)

## 2022-06-26 LAB — CBC WITH DIFF
BASOPHIL #: 0 10*3/uL (ref 0.00–0.10)
BASOPHIL %: 1 % (ref 0–1)
EOSINOPHIL #: 0 10*3/uL (ref 0.00–0.50)
EOSINOPHIL %: 0 % — ABNORMAL LOW
HCT: 37.5 % (ref 31.2–41.9)
HGB: 12.2 g/dL (ref 10.9–14.3)
LYMPHOCYTE #: 1 10*3/uL (ref 1.00–3.00)
LYMPHOCYTE %: 23 % (ref 16–44)
MCH: 27 pg (ref 24.7–32.8)
MCHC: 32.5 g/dL (ref 32.3–35.6)
MCV: 83.2 fL (ref 75.5–95.3)
MONOCYTE #: 0.3 10*3/uL (ref 0.30–1.00)
MONOCYTE %: 8 % (ref 5–13)
MPV: 8.9 fL (ref 7.9–10.8)
NEUTROPHIL #: 3 10*3/uL (ref 1.85–7.80)
NEUTROPHIL %: 69 % (ref 43–77)
PLATELETS: 252 10*3/uL (ref 140–440)
RBC: 4.51 10*6/uL (ref 3.63–4.92)
RDW: 15.7 % (ref 12.3–17.7)
WBC: 4.3 10*3/uL (ref 3.8–11.8)

## 2022-06-26 LAB — IRON TRANSFERRIN AND TIBC
IRON (TRANSFERRIN) SATURATION: 16 % (ref 15–50)
IRON: 50 ug/dL (ref 50–212)
TOTAL IRON BINDING CAPACITY: 309 ug/dL (ref 250–450)
TRANSFERRIN: 221 mg/dL (ref 203–362)
UIBC: 259 ug/dL (ref 130–375)

## 2022-06-26 LAB — FERRITIN: FERRITIN: 14 ng/mL (ref 11–336)

## 2022-06-26 MED ORDER — OXYBUTYNIN CHLORIDE 5 MG TABLET
5.0000 mg | ORAL_TABLET | Freq: Every day | ORAL | 1 refills | Status: DC
Start: 2022-06-26 — End: 2022-11-06

## 2022-06-26 MED ORDER — FLUOXETINE 40 MG CAPSULE
40.0000 mg | ORAL_CAPSULE | Freq: Every day | ORAL | 1 refills | Status: DC
Start: 2022-06-26 — End: 2022-06-26

## 2022-06-26 MED ORDER — ERGOCALCIFEROL (VITAMIN D2) 1,250 MCG (50,000 UNIT) CAPSULE
50000.0000 [IU] | ORAL_CAPSULE | ORAL | 1 refills | Status: DC
Start: 2022-06-26 — End: 2022-06-26

## 2022-06-26 MED ORDER — CHOLESTYRAMINE (WITH SUGAR) 4 GRAM POWDER FOR SUSP IN A PACKET
4.0000 g | Freq: Two times a day (BID) | ORAL | 1 refills | Status: DC
Start: 2022-06-26 — End: 2022-11-06

## 2022-06-26 MED ORDER — CHOLESTYRAMINE (WITH SUGAR) 4 GRAM POWDER FOR SUSP IN A PACKET
4.0000 g | Freq: Two times a day (BID) | ORAL | 1 refills | Status: DC
Start: 2022-06-26 — End: 2022-06-26

## 2022-06-26 MED ORDER — FLUOXETINE 40 MG CAPSULE
40.0000 mg | ORAL_CAPSULE | Freq: Every day | ORAL | 1 refills | Status: DC
Start: 2022-06-26 — End: 2022-11-06

## 2022-06-26 MED ORDER — FERROUS SULFATE 325 MG (65 MG IRON) TABLET
325.0000 mg | ORAL_TABLET | Freq: Every day | ORAL | 1 refills | Status: DC
Start: 2022-06-26 — End: 2022-11-06

## 2022-06-26 MED ORDER — ERGOCALCIFEROL (VITAMIN D2) 1,250 MCG (50,000 UNIT) CAPSULE
50000.0000 [IU] | ORAL_CAPSULE | ORAL | 1 refills | Status: DC
Start: 2022-06-26 — End: 2022-11-06

## 2022-06-26 MED ORDER — OXYBUTYNIN CHLORIDE 5 MG TABLET
5.0000 mg | ORAL_TABLET | Freq: Every day | ORAL | 1 refills | Status: DC
Start: 2022-06-26 — End: 2022-06-26

## 2022-06-26 MED ORDER — FERROUS SULFATE 325 MG (65 MG IRON) TABLET
325.0000 mg | ORAL_TABLET | Freq: Every day | ORAL | 1 refills | Status: DC
Start: 2022-06-26 — End: 2022-06-26

## 2022-06-26 NOTE — Nursing Note (Signed)
Patient is here for her follow up. Patient reports no new problems.

## 2022-06-26 NOTE — Progress Notes (Signed)
[facility]  [address]  [place], [place]  [address]  Phone: [phone] Fax: [phone]    Name: Carol Barajas                       Date of Birth: 1970-08-11   MRN:  [id number]                         Date of visit: 06/26/2022     Chief Complaint: Follow Up 3 Months (No new issues. )    Current Outpatient Medications   Medication Sig    cholestyramine-sucrose (QUESTRAN) 4 gram Oral Powder in Packet Take 1 Packet by mouth Twice daily for 180 days    ergocalciferol, vitamin D2, (DRISDOL) 1,250 mcg (50,000 unit) Oral Capsule Take 1 Capsule (50,000 Units total) by mouth Every 7 days for 180 days    ferrous sulfate (FEOSOL) 325 mg (65 mg iron) Oral Tablet Take 1 Tablet (325 mg total) by mouth Once a day for 180 days    FLUoxetine (PROZAC) 40 mg Oral Capsule Take 1 Capsule (40 mg total) by mouth Once a day for 180 days    KESIMPTA PEN 20 mg/0.4 mL Subcutaneous Pen Injector Inject 0.4 mL (20 mg total) under the skin Every 30 days    OXcarbazepine (TRILEPTAL) 600 mg Oral Tablet Take 1 Tablet (600 mg total) by mouth Twice daily    oxyBUTYnin (DITROPAN) 5 mg Oral Tablet Take 1 Tablet (5 mg total) by mouth Once a day for 180 days       Patient Active Problem List    Diagnosis Date Noted    Urinary incontinence 08/02/2021    Hepatic cyst 08/02/2021    Major depression, recurrent (CMS HCC) 08/02/2021    Iron deficiency anemia 01/14/2011    Multiple sclerosis (CMS HCC) 01/14/2011    Sleep apnea 01/14/2011    Seizure disorder (CMS HCC) 01/13/2011    Chronic diarrhea of unknown origin 05/25/2009     Formatting of this note might be different from the original.  Gastroenteritis         Subjective:   Patient is here for CDM visit.    Hepatic Cysts  Originally identified on extended imaging performed by Dr Andee Poles  03-05-21 Multiple hepatic cysts are identified.  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    Multiple sclerosis  Currently on Kesimpta  Patient was informed in March 2020 that I was not comfortable treating  her MS.  Largely due to her continued deterioration and most likely need for medication adjustment in the near future.  Patient was supposed to call back with name of neurology she would like to be referred to, but she did not do so  Was following with Dr.Paudyal at his departure.  The nurse practitioners at this clinic resumed her medication, but had not really performing any monitoring.  Symptoms are gradually getting worse per patient and available documentation.  Had not seen a neurologist since August 2016 from any record I can find.  10-2018 Referral placed to Dr Marta Antu. Apt scheduled for 11-25-2018.   Scheduled with [facility]. Scheduled for 02-09-2019.  01-2019 Patient called crying. Notes her apt with neurology was rescheduled. She will be out of medication.   *At appointment today we called Dr. Cleophus Molt office.  Secretary did note that patient was scheduled and had appointment canceled.  Patient had called and informed that she had just gone to a large wedding.  No masks or social distancing.  They did not feel safe bringing patient into office, but did offer to reschedule.  Patient refused.  02-2019 MRI There is abnormal signal predominately involving the inferomedial left temporal lobe with atrophic changes.  On T2 coronal, there is cortical thinning and subcortical increased T2 signal.  The abnormal white matter signal extends posteriorly to a point adjacent to the left occipital horn, similar to previous.  This is unchanged to 2015.  04-2019 Now following with Dr Andee Poles in [place]  10-2019 No changes  04-2020 Noting more gradual weakness in legs, especially right. Now using cane occasionally. Not using today. No changes in medication, but they are recommending Brand Only medication.  [id number] Continuing to have gradual weakness.  01-2021 Patient given steroids. Doing much better.  04-2021 Now on Kesimpta. Notes more trouble with gait.  07-2021 Still having a lot of trouble with gait.   13-0865 Gait  back to baseline  12-2021 MRI thoracic spine: Subtle foci of increased signal intensity in the lower thoracic spinal cord, unchanged from 01-11-2021. No new findings  12-2021 MRI brain: Periventricular white matter signal abnormalities unchanged from 01-11-2021. Abnormal signal intesnity in the left temporal lobe. Unchanged from 01-11-2021. No new findings.   03-2022 Having weakness after a seizure a few weeks ago. Dr [facility]'s office suggested supportive device. No med changes.   [phone] Dr Andee Poles has scheduled with home health for physical therapy and is supposed to order a rollator walker. Only started last week.     Seizures  Sounds like absence type. Previously grand mal.  Currently on oxcarbazepine at maximum dose.  10-2018 Referral placed to Dr Marta Antu. Apt scheduled for 11-25-2018.   * Was changed to [facility]. Scheduled for 02-09-2019.  01-2019 Patient called crying. Notes her apt with neurology was rescheduled. She will be out of medication.  04-2019 Now following with Dr Andee Poles in [place].  29-5284 No Changes  04-2020 No changes. They are recommending she be on Brand Only medication.  01-2021 no changes  03-2022 Had bad seizure. Now having weakness.     Iron deficiency anemia  On replacement  Labs on 01-10-2019 showed hemoglobin 12.3  Labs on 10-17-2019 showed hemoglobin 12.2  Labs on 10-22-2020 showed hemoglobin 12.1  Labs on 11/05/2021 showed hemoglobin 12.1, iron 47, transferrin 189, ferritin 22.  All other normal    Chronic depression  Doing well with Prozac    Urinary Incontinence  Doing well with oxybutynin  We did not originally start this medication  Some risk of crossing BBB, and worsening neuro symptoms    Recurrent Diarrhea  Ongoing since at least 2011 per records  Patient thinks it may have something to do with milk, but tends to end up eating/drinking dairy products anyway  Have offered to refer patient to GI, but she refuses  Patient was prescribed Cholestyramine, which works well when  she takes  CT 03-2021 was negative for causative etiology, but did not hepatic cysts    Mammogram 05-15-22 Birads 2    ROS:  10 systems reviewed and were negative except as noted.   + fatigue  + abnormal gait, back pain, joint pain, muscle weakness  + seizures  + anxiety, depression    Objective :  BP (!) 151/78 (Site: Right, Patient Position: Sitting, Cuff Size: Adult)   Pulse 72   Resp 16   Ht 1.651 m (5\' 5" )   Wt 61.7 kg (136 lb 2 oz)   SpO2 99%  BMI 22.65 kg/m       General:  appears in good health, comfortable  Lungs:  Clear to auscultation and percussion bilaterally.   Cardiovascular:  regular rate and rhythm, S1, S2 normal, no murmur, click, rub or gallop  Psychiatric:  Normal  Ambulation with cane.     Data reviewed:      Assessment/Plan:  Assessment/Plan   1. Chronic diarrhea of unknown origin    2. Sleep apnea, unspecified type    3. Urinary incontinence    4. Hepatic cyst    5. Iron deficiency anemia    6. Recurrent major depressive disorder, in partial remission (CMS HCC)    7. Seizure disorder (CMS HCC)    8. Multiple sclerosis (CMS HCC)      Multiple sclerosis  Follow with Neuro.   We will go ahead and order walker. Patient has actually been pending neuro to order the walker for about 6 months and still has not happened.     Seizure disorder  Follow with Neuro.    Iron deficiency anemia  Continue replacement    Depression  Continue Prozac    Hepatic Cysts  Monitor    Urinary Incontinence  Continue ditropan    Recurrent Diarrhea  Avoid dairy  Use Lactaid or similar when cannot avoid  Use Cholestyramine to help with symptom control  Can refer to GI for further evaluation    Terald Sleeper, DO, [facility]  Internal Medicine

## 2022-06-30 ENCOUNTER — Telehealth (RURAL_HEALTH_CENTER): Payer: Self-pay | Admitting: INTERNAL MEDICINE

## 2022-06-30 NOTE — Telephone Encounter (Signed)
Pt was notified.  

## 2022-06-30 NOTE — Telephone Encounter (Signed)
-----   Message from Terald Sleeper, DO sent at 06/27/2022  8:26 AM EDT -----  Iron levels are low normal, but at least they are normal  No changes

## 2022-08-05 ENCOUNTER — Ambulatory Visit (RURAL_HEALTH_CENTER): Payer: Self-pay | Admitting: Family

## 2022-08-08 ENCOUNTER — Ambulatory Visit (RURAL_HEALTH_CENTER): Payer: Self-pay | Admitting: Family

## 2022-09-26 ENCOUNTER — Ambulatory Visit (RURAL_HEALTH_CENTER): Payer: Self-pay | Admitting: INTERNAL MEDICINE

## 2022-10-14 ENCOUNTER — Ambulatory Visit (RURAL_HEALTH_CENTER): Payer: Self-pay | Admitting: INTERNAL MEDICINE

## 2022-10-14 NOTE — Progress Notes (Deleted)
[facility]  [address]  [place], [place]  [address]  Phone: [phone] Fax: [phone]    Name: Carol Barajas                       Date of Birth: 10/18/1970   MRN:  [id number]                         Date of visit: 10/14/2022     Chief Complaint: No chief complaint on file.    Current Outpatient Medications   Medication Sig    cholestyramine-sucrose (QUESTRAN) 4 gram Oral Powder in Packet Take 1 Packet by mouth Twice daily for 180 days    ergocalciferol, vitamin D2, (DRISDOL) 1,250 mcg (50,000 unit) Oral Capsule Take 1 Capsule (50,000 Units total) by mouth Every 7 days for 180 days    ferrous sulfate (FEOSOL) 325 mg (65 mg iron) Oral Tablet Take 1 Tablet (325 mg total) by mouth Once a day for 180 days    FLUoxetine (PROZAC) 40 mg Oral Capsule Take 1 Capsule (40 mg total) by mouth Once a day for 180 days    KESIMPTA PEN 20 mg/0.4 mL Subcutaneous Pen Injector Inject 0.4 mL (20 mg total) under the skin Every 30 days    OXcarbazepine (TRILEPTAL) 600 mg Oral Tablet Take 1 Tablet (600 mg total) by mouth Twice daily    oxyBUTYnin (DITROPAN) 5 mg Oral Tablet Take 1 Tablet (5 mg total) by mouth Once a day for 180 days       Patient Active Problem List    Diagnosis Date Noted    Urinary incontinence 08/02/2021    Hepatic cyst 08/02/2021    Major depression, recurrent (CMS HCC) 08/02/2021    Iron deficiency anemia 01/14/2011    Multiple sclerosis (CMS HCC) 01/14/2011    Sleep apnea 01/14/2011    Seizure disorder (CMS HCC) 01/13/2011    Chronic diarrhea of unknown origin 05/25/2009     Formatting of this note might be different from the original.  Gastroenteritis         Subjective:   Patient is here for CDM visit.    Hepatic Cysts  Originally identified on extended imaging performed by Dr Andee Poles  03-05-21 Multiple hepatic cysts are identified.  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    Multiple sclerosis  Currently on Kesimpta  Patient was informed in March 2020 that I was not comfortable treating her MS.   Largely due to her continued deterioration and most likely need for medication adjustment in the near future.  Patient was supposed to call back with name of neurology she would like to be referred to, but she did not do so  Was following with Dr.Paudyal at his departure.  The nurse practitioners at this clinic resumed her medication, but had not really performing any monitoring.  Symptoms are gradually getting worse per patient and available documentation.  Had not seen a neurologist since August 2016 from any record I can find.  10-2018 Referral placed to Dr Marta Antu. Apt scheduled for 11-25-2018.   Scheduled with [facility]. Scheduled for 02-09-2019.  01-2019 Patient called crying. Notes her apt with neurology was rescheduled. She will be out of medication.   *At appointment today we called Dr. Cleophus Molt office.  Secretary did note that patient was scheduled and had appointment canceled.  Patient had called and informed that she had just gone to a large wedding.  No masks  or social distancing.  They did not feel safe bringing patient into office, but did offer to reschedule.  Patient refused.  02-2019 MRI There is abnormal signal predominately involving the inferomedial left temporal lobe with atrophic changes.  On T2 coronal, there is cortical thinning and subcortical increased T2 signal.  The abnormal white matter signal extends posteriorly to a point adjacent to the left occipital horn, similar to previous.  This is unchanged to 2015.  04-2019 Now following with Dr Andee Poles in [place]  10-2019 No changes  04-2020 Noting more gradual weakness in legs, especially right. Now using cane occasionally. Not using today. No changes in medication, but they are recommending Brand Only medication.  [phone] Continuing to have gradual weakness.  01-2021 Patient given steroids. Doing much better.  04-2021 Now on Kesimpta. Notes more trouble with gait.  07-2021 Still having a lot of trouble with gait.   30-1601 Gait back to  baseline  12-2021 MRI thoracic spine: Subtle foci of increased signal intensity in the lower thoracic spinal cord, unchanged from 01-11-2021. No new findings  12-2021 MRI brain: Periventricular white matter signal abnormalities unchanged from 01-11-2021. Abnormal signal intesnity in the left temporal lobe. Unchanged from 01-11-2021. No new findings.   03-2022 Having weakness after a seizure a few weeks ago. Dr [facility] office suggested supportive device. No med changes.   11-3233 Dr Andee Poles has scheduled with home health for physical therapy and is supposed to order a rollator walker. Only started last week.     Seizures  Sounds like absence type. Previously grand mal.  Currently on oxcarbazepine at maximum dose.  10-2018 Referral placed to Dr Marta Antu. Apt scheduled for 11-25-2018.   * Was changed to [facility]. Scheduled for 02-09-2019.  01-2019 Patient called crying. Notes her apt with neurology was rescheduled. She will be out of medication.  04-2019 Now following with Dr Andee Poles in [place].  57-3220 No Changes  04-2020 No changes. They are recommending she be on Brand Only medication.  01-2021 no changes  03-2022 Had bad seizure. Now having weakness.     Iron deficiency anemia  On replacement  Labs on 01-10-2019 showed hemoglobin 12.3  Labs on 10-17-2019 showed hemoglobin 12.2  Labs on 10-22-2020 showed hemoglobin 12.1  Labs on 11/05/2021 showed hemoglobin 12.1, iron 47, transferrin 189, ferritin 22.  All other normal  Labs on 06/26/2022 showed hemoglobin 12.2, iron 50, transferrin 221, ferritin 14    Chronic depression  Doing well with Prozac    Urinary Incontinence  Doing well with oxybutynin  We did not originally start this medication  Some risk of crossing BBB, and worsening neuro symptoms    Recurrent Diarrhea  Ongoing since at least 2011 per records  Patient thinks it may have something to do with milk, but tends to end up eating/drinking dairy products anyway  Have offered to refer patient to GI, but  she refuses  Patient was prescribed Cholestyramine, which works well when she takes  CT 03-2021 was negative for causative etiology, but did not hepatic cysts    Mammogram 05-15-22 Birads 2    ROS:  10 systems reviewed and were negative except as noted.   + fatigue  + abnormal gait, back pain, joint pain, muscle weakness  + seizures  + anxiety, depression    Objective :  There were no vitals taken for this visit.      General:  appears in good health, comfortable  Lungs:  Clear to auscultation and percussion bilaterally.  Cardiovascular:  regular rate and rhythm, S1, S2 normal, no murmur, click, rub or gallop  Psychiatric:  Normal  Ambulation with cane.     Data reviewed:      Assessment/Plan:  Assessment/Plan   1. Chronic diarrhea of unknown origin    2. Iron deficiency anemia    3. Mixed stress and urge urinary incontinence    4. Multiple sclerosis (CMS HCC)    5. Seizure disorder (CMS HCC)    6. Hepatic cyst    7. Recurrent major depressive disorder, in partial remission (CMS HCC)        Multiple sclerosis  Follow with Neuro.   We will go ahead and order walker. Patient has actually been pending neuro to order the walker for about 6 months and still has not happened.     Seizure disorder  Follow with Neuro.    Iron deficiency anemia  Continue replacement    Depression  Continue Prozac    Hepatic Cysts  Monitor    Urinary Incontinence  Continue ditropan    Recurrent Diarrhea  Avoid dairy  Use Lactaid or similar when cannot avoid  Use Cholestyramine to help with symptom control  Can refer to GI for further evaluation    Terald Sleeper, DO, [facility]  Internal Medicine

## 2022-11-06 ENCOUNTER — Ambulatory Visit: Payer: Medicare (Managed Care) | Attending: INTERNAL MEDICINE | Admitting: INTERNAL MEDICINE

## 2022-11-06 ENCOUNTER — Ambulatory Visit (RURAL_HEALTH_CENTER): Payer: Medicare (Managed Care) | Attending: INTERNAL MEDICINE | Admitting: INTERNAL MEDICINE

## 2022-11-06 ENCOUNTER — Other Ambulatory Visit: Payer: Self-pay

## 2022-11-06 ENCOUNTER — Encounter (RURAL_HEALTH_CENTER): Payer: Self-pay | Admitting: INTERNAL MEDICINE

## 2022-11-06 VITALS — BP 144/75 | HR 74 | Resp 16 | Ht 64.0 in | Wt 138.5 lb

## 2022-11-06 DIAGNOSIS — G40909 Epilepsy, unspecified, not intractable, without status epilepticus: Secondary | ICD-10-CM | POA: Insufficient documentation

## 2022-11-06 DIAGNOSIS — K7689 Other specified diseases of liver: Secondary | ICD-10-CM | POA: Insufficient documentation

## 2022-11-06 DIAGNOSIS — N3946 Mixed incontinence: Secondary | ICD-10-CM | POA: Insufficient documentation

## 2022-11-06 DIAGNOSIS — F3341 Major depressive disorder, recurrent, in partial remission: Secondary | ICD-10-CM | POA: Insufficient documentation

## 2022-11-06 DIAGNOSIS — Z124 Encounter for screening for malignant neoplasm of cervix: Secondary | ICD-10-CM | POA: Insufficient documentation

## 2022-11-06 DIAGNOSIS — G4730 Sleep apnea, unspecified: Secondary | ICD-10-CM | POA: Insufficient documentation

## 2022-11-06 DIAGNOSIS — G35 Multiple sclerosis: Secondary | ICD-10-CM | POA: Insufficient documentation

## 2022-11-06 DIAGNOSIS — D509 Iron deficiency anemia, unspecified: Secondary | ICD-10-CM | POA: Insufficient documentation

## 2022-11-06 DIAGNOSIS — K529 Noninfective gastroenteritis and colitis, unspecified: Secondary | ICD-10-CM | POA: Insufficient documentation

## 2022-11-06 DIAGNOSIS — Z1211 Encounter for screening for malignant neoplasm of colon: Secondary | ICD-10-CM | POA: Insufficient documentation

## 2022-11-06 MED ORDER — FERROUS SULFATE 325 MG (65 MG IRON) TABLET
325.0000 mg | ORAL_TABLET | Freq: Every day | ORAL | 1 refills | Status: DC
Start: 2022-11-06 — End: 2022-11-06

## 2022-11-06 MED ORDER — OXYBUTYNIN CHLORIDE 5 MG TABLET
5.0000 mg | ORAL_TABLET | Freq: Every day | ORAL | 1 refills | Status: DC
Start: 2022-11-06 — End: 2023-02-09

## 2022-11-06 MED ORDER — FERROUS SULFATE 325 MG (65 MG IRON) TABLET
325.0000 mg | ORAL_TABLET | Freq: Every day | ORAL | 1 refills | Status: DC
Start: 2022-11-06 — End: 2023-02-09

## 2022-11-06 MED ORDER — CHOLESTYRAMINE (WITH SUGAR) 4 GRAM POWDER FOR SUSP IN A PACKET
4.0000 g | Freq: Two times a day (BID) | ORAL | 1 refills | Status: DC
Start: 2022-11-06 — End: 2022-11-06

## 2022-11-06 MED ORDER — CHOLESTYRAMINE (WITH SUGAR) 4 GRAM POWDER FOR SUSP IN A PACKET
4.0000 g | Freq: Two times a day (BID) | ORAL | 1 refills | Status: DC
Start: 2022-11-06 — End: 2023-02-09

## 2022-11-06 MED ORDER — ERGOCALCIFEROL (VITAMIN D2) 1,250 MCG (50,000 UNIT) CAPSULE
50000.0000 [IU] | ORAL_CAPSULE | ORAL | 1 refills | Status: DC
Start: 2022-11-06 — End: 2023-02-09

## 2022-11-06 MED ORDER — FLUOXETINE 40 MG CAPSULE
40.0000 mg | ORAL_CAPSULE | Freq: Every day | ORAL | 1 refills | Status: DC
Start: 2022-11-06 — End: 2022-11-06

## 2022-11-06 MED ORDER — ERGOCALCIFEROL (VITAMIN D2) 1,250 MCG (50,000 UNIT) CAPSULE
50000.0000 [IU] | ORAL_CAPSULE | ORAL | 1 refills | Status: DC
Start: 2022-11-06 — End: 2022-11-06

## 2022-11-06 MED ORDER — FLUOXETINE 40 MG CAPSULE
40.0000 mg | ORAL_CAPSULE | Freq: Every day | ORAL | 1 refills | Status: DC
Start: 2022-11-06 — End: 2023-02-09

## 2022-11-06 MED ORDER — OXYBUTYNIN CHLORIDE 5 MG TABLET
5.0000 mg | ORAL_TABLET | Freq: Every day | ORAL | 1 refills | Status: DC
Start: 2022-11-06 — End: 2022-11-06

## 2022-11-06 NOTE — Nursing Note (Signed)
Patient is here for her follow up. Patient reports no new problems.

## 2022-11-06 NOTE — Progress Notes (Signed)
[facility]  [address]  [place], [place]  [address]  Phone: [phone] Fax: [phone]    Name: Carol Barajas                       Date of Birth: 1970-10-04   MRN:  [id number]                         Date of visit: 11/06/2022     Chief Complaint: Follow Up 3 Months (No new problems)    Current Outpatient Medications   Medication Sig    cholestyramine-sucrose (QUESTRAN) 4 gram Oral Powder in Packet Take 1 Packet by mouth Twice daily for 180 days    ergocalciferol, vitamin D2, (DRISDOL) 1,250 mcg (50,000 unit) Oral Capsule Take 1 Capsule (50,000 Units total) by mouth Every 7 days for 180 days    ferrous sulfate (FEOSOL) 325 mg (65 mg iron) Oral Tablet Take 1 Tablet (325 mg total) by mouth Once a day for 180 days    FLUoxetine (PROZAC) 40 mg Oral Capsule Take 1 Capsule (40 mg total) by mouth Once a day for 180 days    KESIMPTA PEN 20 mg/0.4 mL Subcutaneous Pen Injector Inject 0.4 mL (20 mg total) under the skin Every 30 days    OXcarbazepine (TRILEPTAL) 600 mg Oral Tablet Take 1 Tablet (600 mg total) by mouth Twice daily    oxyBUTYnin (DITROPAN) 5 mg Oral Tablet Take 1 Tablet (5 mg total) by mouth Once a day for 180 days       Patient Active Problem List    Diagnosis Date Noted    Urinary incontinence 08/02/2021    Hepatic cyst 08/02/2021    Major depression, recurrent (CMS HCC) 08/02/2021    Iron deficiency anemia 01/14/2011    Multiple sclerosis (CMS HCC) 01/14/2011    Sleep apnea 01/14/2011    Seizure disorder (CMS HCC) 01/13/2011    Chronic diarrhea of unknown origin 05/25/2009     Formatting of this note might be different from the original.  Gastroenteritis         Subjective:   Patient is here for CDM visit.    Brother passed away about a month ago. Patient and brother lived together in [place], but after his passing she had to move to [place] to live with her sister.    Hepatic Cysts  Originally identified on extended imaging performed by Dr Andee Poles  03-05-21 Multiple hepatic cysts are  identified.  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    Multiple sclerosis  Currently on Kesimpta  Patient was informed in March 2020 that I was not comfortable treating her MS.  Largely due to her continued deterioration and most likely need for medication adjustment in the near future.  Patient was supposed to call back with name of neurology she would like to be referred to, but she did not do so  Was following with Dr.Paudyal at his departure.  The nurse practitioners at this clinic resumed her medication, but had not really performing any monitoring.  Symptoms are gradually getting worse per patient and available documentation.  Had not seen a neurologist since August 2016 from any record I can find.  10-2018 Referral placed to Dr Marta Antu. Apt scheduled for 11-25-2018.   Scheduled with [facility]. Scheduled for 02-09-2019.  01-2019 Patient called crying. Notes her apt with neurology was rescheduled. She will be out of medication.   *At appointment today we  called Dr. Cleophus Molt office.  Secretary did note that patient was scheduled and had appointment canceled.  Patient had called and informed that she had just gone to a large wedding.  No masks or social distancing.  They did not feel safe bringing patient into office, but did offer to reschedule.  Patient refused.  02-2019 MRI There is abnormal signal predominately involving the inferomedial left temporal lobe with atrophic changes.  On T2 coronal, there is cortical thinning and subcortical increased T2 signal.  The abnormal white matter signal extends posteriorly to a point adjacent to the left occipital horn, similar to previous.  This is unchanged to 2015.  04-2019 Now following with Dr Andee Poles in [place]  10-2019 No changes  04-2020 Noting more gradual weakness in legs, especially right. Now using cane occasionally. Not using today. No changes in medication, but they are recommending Brand Only medication.  16-1096 Continuing to have gradual  weakness.  01-2021 Patient given steroids. Doing much better.  04-2021 Now on Kesimpta. Notes more trouble with gait.  07-2021 Still having a lot of trouble with gait.   06-5407 Gait back to baseline  12-2021 MRI thoracic spine: Subtle foci of increased signal intensity in the lower thoracic spinal cord, unchanged from 01-11-2021. No new findings  12-2021 MRI brain: Periventricular white matter signal abnormalities unchanged from 01-11-2021. Abnormal signal intesnity in the left temporal lobe. Unchanged from 01-11-2021. No new findings.   03-2022 Having weakness after a seizure a few weeks ago. Dr [facility]'s office suggested supportive device. No med changes.   81-1914 Dr Andee Poles has scheduled with home health for physical therapy and is supposed to order a rollator walker. Only started last week.   11-2022 Notes some worsening of symptoms. Sees Neuro next month.     Seizures  Sounds like absence type. Previously grand mal.  Currently on oxcarbazepine at maximum dose.  10-2018 Referral placed to Dr Marta Antu. Apt scheduled for 11-25-2018.   * Was changed to [facility]. Scheduled for 02-09-2019.  01-2019 Patient called crying. Notes her apt with neurology was rescheduled. She will be out of medication.  04-2019 Now following with Dr Andee Poles in [place].  78-2956 No Changes  04-2020 No changes. They are recommending she be on Brand Only medication.  01-2021 no changes  03-2022 Had severe seizure. Now having weakness.   21-3086 still having semi regular seizures. Nothing severe though.     Iron deficiency anemia  On replacement  Labs on 01-10-2019 showed hemoglobin 12.3  Labs on 10-17-2019 showed hemoglobin 12.2  Labs on 10-22-2020 showed hemoglobin 12.1  Labs on 11/05/2021 showed hemoglobin 12.1, iron 47, transferrin 189, ferritin 22.  All other normal  Labs on 06/26/2022 showed hemoglobin 12.2, iron 50, transferrin 221, ferritin 14    Chronic depression  Doing well with Prozac    Urinary Incontinence  Doing well with  oxybutynin  We did not originally start this medication  Some risk of crossing BBB, and worsening neuro symptoms    Recurrent Diarrhea  Ongoing since at least 2011 per records  Patient thinks it may have something to do with milk, but tends to end up eating/drinking dairy products anyway  Have offered to refer patient to GI, but she refuses  Patient was prescribed Cholestyramine, which works well when she takes  CT 03-2021 was negative for causative etiology, but did note hepatic cysts    Mammogram 05-15-22 Birads 2    ROS:  10 systems reviewed and were negative except as noted.   +  fatigue  + abnormal gait, back pain, joint pain, muscle weakness  + seizures  + anxiety, depression    Objective :  BP (!) 144/75 (Site: Right Arm, Patient Position: Sitting, Cuff Size: Adult)   Pulse 74   Resp 16   Ht 1.626 m (5\' 4" )   Wt 62.8 kg (138 lb 8 oz)   SpO2 98%   BMI 23.77 kg/m       General:  appears in good health, comfortable  Lungs:  Clear to auscultation and percussion bilaterally.   Cardiovascular:  regular rate and rhythm, S1, S2 normal, no murmur, click, rub or gallop  Psychiatric:  Normal  Ambulation with cane.     Data reviewed:      Assessment/Plan:  Assessment/Plan   1. Iron deficiency anemia, unspecified iron deficiency anemia type    2. Mixed stress and urge urinary incontinence    3. Chronic diarrhea of unknown origin    4. Hepatic cyst    5. Sleep apnea, unspecified type    6. Recurrent major depressive disorder, in partial remission (CMS HCC)    7. Multiple sclerosis (CMS HCC)    8. Seizure disorder (CMS [facility])        Multiple sclerosis  Follow with Neuro.     Seizure disorder  Follow with Neuro.    Iron deficiency anemia  Continue replacement    Depression  Continue Prozac    Hepatic Cysts  Monitor    Urinary Incontinence  Continue ditropan    Recurrent Diarrhea  Avoid dairy  Use Lactaid or similar when cannot avoid  Use Cholestyramine to help with symptom control  Can refer to GI for further  evaluation    Willing for pap and colonoscopy, but wants referral to providers in [place].     Recommended patient to receive influenza and COVID vaccine this fall.     Terald Sleeper, DO, [facility]  Internal Medicine

## 2022-11-07 ENCOUNTER — Telehealth (RURAL_HEALTH_CENTER): Payer: Self-pay | Admitting: INTERNAL MEDICINE

## 2022-11-07 LAB — IRON TRANSFERRIN AND TIBC
IRON (TRANSFERRIN) SATURATION: 20 % (ref 15–50)
IRON: 63 ug/dL (ref 50–212)
TOTAL IRON BINDING CAPACITY: 311 ug/dL (ref 250–450)
TRANSFERRIN: 222 mg/dL (ref 203–362)
UIBC: 248 ug/dL (ref 130–375)

## 2022-11-07 LAB — COMPREHENSIVE METABOLIC PNL, FASTING
ALBUMIN/GLOBULIN RATIO: 1.4 (ref 0.8–1.4)
ALBUMIN: 4.5 g/dL (ref 3.5–5.7)
ALKALINE PHOSPHATASE: 108 U/L — ABNORMAL HIGH (ref 34–104)
ALT (SGPT): 18 U/L (ref 7–52)
ANION GAP: 6 mmol/L (ref 4–13)
AST (SGOT): 26 U/L (ref 13–39)
BILIRUBIN TOTAL: 0.2 mg/dL — ABNORMAL LOW (ref 0.3–1.0)
BUN/CREA RATIO: 10 (ref 6–22)
BUN: 8 mg/dL (ref 7–25)
CALCIUM, CORRECTED: 8.8 mg/dL — ABNORMAL LOW (ref 8.9–10.8)
CALCIUM: 9.2 mg/dL (ref 8.6–10.3)
CHLORIDE: 101 mmol/L (ref 98–107)
CO2 TOTAL: 31 mmol/L (ref 21–31)
CREATININE: 0.83 mg/dL (ref 0.60–1.30)
ESTIMATED GFR: 85 mL/min/{1.73_m2} (ref >59)
GLOBULIN: 3.2 (ref 2.9–5.4)
GLUCOSE: 102 mg/dL (ref 74–109)
OSMOLALITY, CALCULATED: 274 mOsm/kg (ref 270–290)
POTASSIUM: 4.2 mmol/L (ref 3.5–5.1)
PROTEIN TOTAL: 7.7 g/dL (ref 6.4–8.9)
SODIUM: 138 mmol/L (ref 136–145)

## 2022-11-07 LAB — CBC WITH DIFF
BASOPHIL #: 0 10*3/uL (ref 0.00–0.10)
BASOPHIL %: 1 % (ref 0–1)
EOSINOPHIL #: 0 10*3/uL (ref 0.00–0.50)
EOSINOPHIL %: 0 % — ABNORMAL LOW (ref 1–7)
HCT: 38.8 % (ref 31.2–41.9)
HGB: 12.6 g/dL (ref 10.9–14.3)
LYMPHOCYTE #: 0.9 10*3/uL — ABNORMAL LOW (ref 1.00–3.00)
LYMPHOCYTE %: 18 % (ref 16–44)
MCH: 27.2 pg (ref 24.7–32.8)
MCHC: 32.6 g/dL (ref 32.3–35.6)
MCV: 83.5 fL (ref 75.5–95.3)
MONOCYTE #: 0.3 10*3/uL (ref 0.30–1.00)
MONOCYTE %: 6 % (ref 5–13)
MPV: 9.2 fL (ref 7.9–10.8)
NEUTROPHIL #: 3.8 10*3/uL (ref 1.85–7.80)
NEUTROPHIL %: 75 % (ref 43–77)
PLATELETS: 261 10*3/uL (ref 140–440)
RBC: 4.64 10*6/uL (ref 3.63–4.92)
RDW: 14.9 % (ref 12.3–17.7)
WBC: 5 10*3/uL (ref 3.8–11.8)

## 2022-11-07 LAB — FERRITIN: FERRITIN: 18 ng/mL (ref 11–336)

## 2022-11-07 NOTE — Telephone Encounter (Signed)
Pt was notified by v-m

## 2022-11-07 NOTE — Telephone Encounter (Signed)
-----   Message from Mardee Postin McClanahan sent at 11/07/2022 10:03 AM EDT -----  Labs ok  About the same as normal

## 2023-02-09 ENCOUNTER — Other Ambulatory Visit: Payer: Self-pay

## 2023-02-09 ENCOUNTER — Encounter (RURAL_HEALTH_CENTER): Payer: Self-pay | Admitting: INTERNAL MEDICINE

## 2023-02-09 ENCOUNTER — Ambulatory Visit (RURAL_HEALTH_CENTER): Payer: Medicare (Managed Care) | Attending: INTERNAL MEDICINE | Admitting: INTERNAL MEDICINE

## 2023-02-09 DIAGNOSIS — D509 Iron deficiency anemia, unspecified: Secondary | ICD-10-CM | POA: Insufficient documentation

## 2023-02-09 DIAGNOSIS — F3341 Major depressive disorder, recurrent, in partial remission: Secondary | ICD-10-CM | POA: Insufficient documentation

## 2023-02-09 DIAGNOSIS — K529 Noninfective gastroenteritis and colitis, unspecified: Secondary | ICD-10-CM | POA: Insufficient documentation

## 2023-02-09 DIAGNOSIS — Z79899 Other long term (current) drug therapy: Secondary | ICD-10-CM | POA: Insufficient documentation

## 2023-02-09 DIAGNOSIS — G35 Multiple sclerosis: Secondary | ICD-10-CM | POA: Insufficient documentation

## 2023-02-09 DIAGNOSIS — N3946 Mixed incontinence: Secondary | ICD-10-CM | POA: Insufficient documentation

## 2023-02-09 DIAGNOSIS — G40909 Epilepsy, unspecified, not intractable, without status epilepticus: Secondary | ICD-10-CM | POA: Insufficient documentation

## 2023-02-09 DIAGNOSIS — K7689 Other specified diseases of liver: Secondary | ICD-10-CM | POA: Insufficient documentation

## 2023-02-09 MED ORDER — CHOLESTYRAMINE (WITH SUGAR) 4 GRAM POWDER FOR SUSP IN A PACKET
4.0000 g | Freq: Two times a day (BID) | ORAL | 1 refills | Status: DC
Start: 2023-02-09 — End: 2023-05-12

## 2023-02-09 MED ORDER — ERGOCALCIFEROL (VITAMIN D2) 1,250 MCG (50,000 UNIT) CAPSULE
50000.0000 [IU] | ORAL_CAPSULE | ORAL | 1 refills | Status: DC
Start: 2023-02-09 — End: 2023-05-12

## 2023-02-09 MED ORDER — OXYBUTYNIN CHLORIDE 5 MG TABLET
5.0000 mg | ORAL_TABLET | Freq: Every day | ORAL | 1 refills | Status: DC
Start: 2023-02-09 — End: 2023-05-12

## 2023-02-09 MED ORDER — CHOLESTYRAMINE (WITH SUGAR) 4 GRAM POWDER FOR SUSP IN A PACKET
4.0000 g | Freq: Two times a day (BID) | ORAL | 1 refills | Status: DC
Start: 2023-02-09 — End: 2023-02-09

## 2023-02-09 MED ORDER — FLUOXETINE 40 MG CAPSULE
40.0000 mg | ORAL_CAPSULE | Freq: Every day | ORAL | 1 refills | Status: DC
Start: 2023-02-09 — End: 2023-05-12

## 2023-02-09 MED ORDER — FERROUS SULFATE 325 MG (65 MG IRON) TABLET
325.0000 mg | ORAL_TABLET | Freq: Every day | ORAL | 1 refills | Status: DC
Start: 2023-02-09 — End: 2023-02-09

## 2023-02-09 MED ORDER — FERROUS SULFATE 325 MG (65 MG IRON) TABLET
325.0000 mg | ORAL_TABLET | Freq: Every day | ORAL | 1 refills | Status: DC
Start: 2023-02-09 — End: 2023-05-12

## 2023-02-09 MED ORDER — FLUOXETINE 40 MG CAPSULE
40.0000 mg | ORAL_CAPSULE | Freq: Every day | ORAL | 1 refills | Status: DC
Start: 2023-02-09 — End: 2023-02-09

## 2023-02-09 MED ORDER — ERGOCALCIFEROL (VITAMIN D2) 1,250 MCG (50,000 UNIT) CAPSULE
50000.0000 [IU] | ORAL_CAPSULE | ORAL | 1 refills | Status: DC
Start: 2023-02-09 — End: 2023-02-09

## 2023-02-09 MED ORDER — OXYBUTYNIN CHLORIDE 5 MG TABLET
5.0000 mg | ORAL_TABLET | Freq: Every day | ORAL | 1 refills | Status: DC
Start: 2023-02-09 — End: 2023-02-09

## 2023-02-09 NOTE — Nursing Note (Signed)
Patient is telehealth visit. Patient reports she has a lot of weakness in her right leg.

## 2023-02-09 NOTE — Progress Notes (Signed)
[facility]  [address]  [place], [place]  [address]  Phone: [phone] Fax: [phone]    Name: Carol Barajas                       Date of Birth: 10/29/1970   MRN:  [id number]                         Date of visit: 02/09/2023     Chief Complaint: Follow Up 3 Months (No new problems)    Current Outpatient Medications   Medication Sig    cholestyramine-sucrose (QUESTRAN) 4 gram Oral Powder in Packet Take 1 Packet by mouth Twice daily for 180 days    ergocalciferol, vitamin D2, (DRISDOL) 1,250 mcg (50,000 unit) Oral Capsule Take 1 Capsule (50,000 Units total) by mouth Every 7 days for 180 days    ferrous sulfate (FEOSOL) 325 mg (65 mg iron) Oral Tablet Take 1 Tablet (325 mg total) by mouth Once a day for 180 days    FLUoxetine (PROZAC) 40 mg Oral Capsule Take 1 Capsule (40 mg total) by mouth Once a day for 180 days    KESIMPTA PEN 20 mg/0.4 mL Subcutaneous Pen Injector Inject 0.4 mL (20 mg total) under the skin Every 30 days    OXcarbazepine (TRILEPTAL) 600 mg Oral Tablet Take 1 Tablet (600 mg total) by mouth Twice daily    oxyBUTYnin (DITROPAN) 5 mg Oral Tablet Take 1 Tablet (5 mg total) by mouth Once a day for 180 days       Patient Active Problem List    Diagnosis Date Noted    Urinary incontinence 08/02/2021    Hepatic cyst 08/02/2021    Major depression, recurrent (CMS HCC) 08/02/2021    Iron deficiency anemia 01/14/2011    Multiple sclerosis (CMS HCC) 01/14/2011    Seizure disorder (CMS HCC) 01/13/2011    Chronic diarrhea of unknown origin 05/25/2009     Formatting of this note might be different from the original.  Gastroenteritis         Subjective:   Patient is here for CDM visit.    The patient/family initiated a request for telephone service.  Verbal consent for this service was obtained from the patient/family.  Telehealth due to transportation.     Hepatic Cysts  Originally identified on extended imaging performed by Dr Andee Poles  03-05-21 Multiple hepatic cysts are identified.  A  possible uterine leiomyoma is noted.    Multiple sclerosis  Currently on Kesimpta  Patient was informed in March 2020 that I was not comfortable treating her MS.  Largely due to her continued deterioration and most likely need for medication adjustment in the near future.  Patient was supposed to call back with name of neurology she would like to be referred to, but she did not do so  Was following with Dr.Paudyal at his departure.  The nurse practitioners at this clinic resumed her medication, but had not really performing any monitoring.  Symptoms are gradually getting worse per patient and available documentation.  Had not seen a neurologist since August 2016 from any record I can find.  10-2018 Referral placed to Dr Marta Antu. Apt scheduled for 11-25-2018.   Scheduled with [facility]. Scheduled for 02-09-2019.  01-2019 Patient called crying. Notes her apt with neurology was rescheduled. She will be out of medication.   *At appointment today we called Dr. Cleophus Molt office.  Secretary did note that patient  was scheduled and had appointment canceled.  Patient had called and informed that she had just gone to a large wedding.  No masks or social distancing.  They did not feel safe bringing patient into office, but did offer to reschedule.  Patient refused.  02-2019 MRI There is abnormal signal predominately involving the inferomedial left temporal lobe with atrophic changes.  On T2 coronal, there is cortical thinning and subcortical increased T2 signal.  The abnormal white matter signal extends posteriorly to a point adjacent to the left occipital horn, similar to previous.  This is unchanged to 2015.  04-2019 Now following with Dr Andee Poles in [place]  10-2019 No changes  04-2020 Noting more gradual weakness in legs, especially right. Now using cane occasionally. Not using today. No changes in medication, but they are recommending Brand Only medication.  [phone] Continuing to have gradual weakness.  01-2021 Patient  given steroids. Doing much better.  04-2021 Now on Kesimpta. Notes more trouble with gait.  07-2021 Still having a lot of trouble with gait.   [phone] Gait back to baseline  12-2021 MRI thoracic spine: Subtle foci of increased signal intensity in the lower thoracic spinal cord, unchanged from 01-11-2021. No new findings  12-2021 MRI brain: Periventricular white matter signal abnormalities unchanged from 01-11-2021. Abnormal signal intesnity in the left temporal lobe. Unchanged from 01-11-2021. No new findings.   03-2022 Having weakness after a seizure a few weeks ago. Dr [facility]'s office suggested supportive device. No med changes.   [phone] Dr Andee Poles has scheduled with home health for physical therapy and is supposed to order a rollator walker. Only started last week.   11-2022 Notes some worsening of symptoms. Sees Neuro next month.   02-2023 Continues to have worsening of symptoms. States they are considering doing a myelogram.     Seizures  Sounds like absence type. Previously grand mal.  Currently on oxcarbazepine at maximum dose.  10-2018 Referral placed to Dr Marta Antu. Apt scheduled for 11-25-2018.   * Was changed to [facility]. Scheduled for 02-09-2019.  01-2019 Patient called crying. Notes her apt with neurology was rescheduled. She will be out of medication.  04-2019 Now following with Dr Andee Poles in [place].  30-1601 No Changes  04-2020 No changes. They are recommending she be on Brand Only medication.  01-2021 no changes  03-2022 Had severe seizure. Now having weakness.   11-3233 still having semi regular seizures. Nothing severe though.     Iron deficiency anemia  On replacement  Labs on 01-10-2019 showed hemoglobin 12.3  Labs on 10-17-2019 showed hemoglobin 12.2  Labs on 10-22-2020 showed hemoglobin 12.1  Labs on 11/05/2021 showed hemoglobin 12.1, iron 47, transferrin 189, ferritin 22.  All other normal  Labs on 06/26/2022 showed hemoglobin 12.2, iron 50, transferrin 221, ferritin 14  Labs on  11/06/2022 showed hemoglobin 12.6, iron studies normal    Chronic depression  Doing well with Prozac    Urinary Incontinence  Doing well with oxybutynin  We did not originally start this medication  Some risk of crossing BBB, and worsening neuro symptoms    Recurrent Diarrhea  Ongoing since at least 2011 per records  Patient thinks it may have something to do with milk, but tends to end up eating/drinking dairy products anyway  Have offered to refer patient to GI, but she refuses  Patient was prescribed Cholestyramine, which works well when she takes  CT 03-2021 was negative for causative etiology, but did note hepatic cysts    Mammogram 05-15-22  Birads 2    ROS:  10 systems reviewed and were negative except as noted.   + fatigue  + abnormal gait, back pain, joint pain, muscle weakness  + seizures  + anxiety, depression    Objective :  There were no vitals taken for this visit.      General:  appears in good health, comfortable  Psychiatric:  Normal    Data reviewed:      Assessment/Plan:  Assessment/Plan   1. Iron deficiency anemia, unspecified iron deficiency anemia type    2. Chronic diarrhea of unknown origin    3. Hepatic cyst    4. Mixed stress and urge urinary incontinence    5. Recurrent major depressive disorder, in partial remission (CMS HCC)    6. Seizure disorder (CMS HCC)    7. Multiple sclerosis (CMS HCC)      Multiple sclerosis  Follow with Neuro.     Seizure disorder  Follow with Neuro.    Iron deficiency anemia  Continue replacement    Depression  Continue Prozac    Hepatic Cysts  Monitor    Urinary Incontinence  Continue ditropan    Recurrent Diarrhea  Avoid dairy  Use Lactaid or similar when cannot avoid  Use Cholestyramine to help with symptom control  Can refer to GI for further evaluation    States had pap through [facility]. Also notes FIT test at that time which was also negative.     Telehealth visit. Audio only. 15 minutes.     Terald Sleeper, DO, [facility]  Internal Medicine

## 2023-02-24 ENCOUNTER — Other Ambulatory Visit: Payer: Self-pay

## 2023-05-12 ENCOUNTER — Other Ambulatory Visit: Payer: Self-pay

## 2023-05-12 ENCOUNTER — Telehealth (INDEPENDENT_AMBULATORY_CARE_PROVIDER_SITE_OTHER): Payer: Self-pay | Admitting: INTERNAL MEDICINE

## 2023-05-12 ENCOUNTER — Encounter (INDEPENDENT_AMBULATORY_CARE_PROVIDER_SITE_OTHER): Payer: Self-pay | Admitting: INTERNAL MEDICINE

## 2023-05-12 ENCOUNTER — Ambulatory Visit (INDEPENDENT_AMBULATORY_CARE_PROVIDER_SITE_OTHER): Payer: Self-pay | Admitting: INTERNAL MEDICINE

## 2023-05-12 VITALS — BP 155/76 | HR 65 | Ht 64.0 in | Wt 142.0 lb

## 2023-05-12 DIAGNOSIS — G35 Multiple sclerosis: Secondary | ICD-10-CM

## 2023-05-12 DIAGNOSIS — K529 Noninfective gastroenteritis and colitis, unspecified: Secondary | ICD-10-CM

## 2023-05-12 DIAGNOSIS — G40909 Epilepsy, unspecified, not intractable, without status epilepticus: Secondary | ICD-10-CM

## 2023-05-12 DIAGNOSIS — F3341 Major depressive disorder, recurrent, in partial remission: Secondary | ICD-10-CM

## 2023-05-12 DIAGNOSIS — N3946 Mixed incontinence: Secondary | ICD-10-CM

## 2023-05-12 DIAGNOSIS — D509 Iron deficiency anemia, unspecified: Secondary | ICD-10-CM

## 2023-05-12 DIAGNOSIS — K7689 Other specified diseases of liver: Secondary | ICD-10-CM

## 2023-05-12 DIAGNOSIS — Z1239 Encounter for other screening for malignant neoplasm of breast: Secondary | ICD-10-CM

## 2023-05-12 MED ORDER — FLUOXETINE 40 MG CAPSULE
40.0000 mg | ORAL_CAPSULE | Freq: Every day | ORAL | 1 refills | Status: AC
Start: 2023-05-12 — End: 2023-11-08

## 2023-05-12 MED ORDER — FERROUS SULFATE 325 MG (65 MG IRON) TABLET
325.0000 mg | ORAL_TABLET | Freq: Every day | ORAL | 1 refills | Status: AC
Start: 2023-05-12 — End: 2023-11-08

## 2023-05-12 MED ORDER — CHOLESTYRAMINE (WITH SUGAR) 4 GRAM POWDER FOR SUSP IN A PACKET
4.0000 g | Freq: Two times a day (BID) | ORAL | 1 refills | Status: AC
Start: 2023-05-12 — End: 2023-11-08

## 2023-05-12 MED ORDER — ERGOCALCIFEROL (VITAMIN D2) 1,250 MCG (50,000 UNIT) CAPSULE
50000.0000 [IU] | ORAL_CAPSULE | ORAL | 1 refills | Status: AC
Start: 2023-05-12 — End: 2023-11-08

## 2023-05-12 MED ORDER — OXYBUTYNIN CHLORIDE 5 MG TABLET
5.0000 mg | ORAL_TABLET | Freq: Every day | ORAL | 1 refills | Status: AC
Start: 2023-05-12 — End: 2023-11-08

## 2023-05-12 NOTE — Telephone Encounter (Signed)
 Pt notified mammo on Tuesday march 25 at 11:30. Referral mailed

## 2023-05-12 NOTE — Progress Notes (Signed)
 [facility]  [address] [place], [place]  [address]  Phone: [phone] Fax: [phone]    Name: Carol Barajas                       Date of Birth: 1970-09-15   MRN:  [id number]                         Date of visit: 05/12/2023     Chief Complaint: Follow Up 3 Months (Said she had a MRI of the of the brain. States her right leg is getting weak and she has PT coming in her home to help with strength building. )    Current Outpatient Medications   Medication Sig    cholestyramine-sucrose (QUESTRAN) 4 gram Oral Powder in Packet Take 1 Packet by mouth Twice daily for 180 days    ergocalciferol, vitamin D2, (DRISDOL) 1,250 mcg (50,000 unit) Oral Capsule Take 1 Capsule (50,000 Units total) by mouth Every 7 days for 180 days    ferrous sulfate (FEOSOL) 325 mg (65 mg iron) Oral Tablet Take 1 Tablet (325 mg total) by mouth Once a day for 180 days    FLUoxetine (PROZAC) 40 mg Oral Capsule Take 1 Capsule (40 mg total) by mouth Once a day for 180 days    KESIMPTA PEN 20 mg/0.4 mL Subcutaneous Pen Injector Inject 0.4 mL (20 mg total) under the skin Every 30 days    OXcarbazepine (TRILEPTAL) 600 mg Oral Tablet Take 1 Tablet (600 mg total) by mouth Twice daily    oxyBUTYnin (DITROPAN) 5 mg Oral Tablet Take 1 Tablet (5 mg total) by mouth Once a day for 180 days       Patient Active Problem List    Diagnosis Date Noted    Urinary incontinence 08/02/2021    Hepatic cyst 08/02/2021    Major depression, recurrent (CMS HCC) 08/02/2021    Iron deficiency anemia 01/14/2011    Multiple sclerosis (CMS HCC) 01/14/2011    Seizure disorder (CMS HCC) 01/13/2011    Chronic diarrhea of unknown origin 05/25/2009     Formatting of this note might be different from the original.  Gastroenteritis         Subjective:   Patient is here for CDM visit.    Hepatic Cysts  Originally identified on extended imaging performed by Dr Andee Poles  03-05-21 Multiple hepatic cysts are identified.  A possible uterine leiomyoma is  noted.    Multiple sclerosis  Currently on Kesimpta  Patient was informed in March 2020 that I was not comfortable treating her MS.  Largely due to her continued deterioration and most likely need for medication adjustment in the near future.  Patient was supposed to call back with name of neurology she would like to be referred to, but she did not do so  Was following with Dr.Paudyal at his departure.  The nurse practitioners at this clinic resumed her medication, but had not really performing any monitoring.  Symptoms are gradually getting worse per patient and available documentation.  Had not seen a neurologist since August 2016 from any record I can find.  10-2018 Referral placed to Dr Marta Antu. Apt scheduled for 11-25-2018.   Scheduled with [facility]. Scheduled for 02-09-2019.  01-2019 Patient called crying. Notes her apt with neurology was rescheduled. She will be out of medication.   *At appointment today we called Dr. Cleophus Molt office.  Secretary did note  that patient was scheduled and had appointment canceled.  Patient had called and informed that she had just gone to a large wedding.  No masks or social distancing.  They did not feel safe bringing patient into office, but did offer to reschedule.  Patient refused.  02-2019 MRI There is abnormal signal predominately involving the inferomedial left temporal lobe with atrophic changes.  On T2 coronal, there is cortical thinning and subcortical increased T2 signal.  The abnormal white matter signal extends posteriorly to a point adjacent to the left occipital horn, similar to previous.  This is unchanged to 2015.  04-2019 Now following with Dr Andee Poles in [place]  10-2019 No changes  04-2020 Noting more gradual weakness in legs, especially right. Now using cane occasionally. Not using today. No changes in medication, but they are recommending Brand Only medication.  [phone] Continuing to have gradual weakness.  01-2021 Patient given steroids. Doing much  better.  04-2021 Now on Kesimpta. Notes more trouble with gait.  07-2021 Still having a lot of trouble with gait.   [phone] Gait back to baseline  12-2021 MRI thoracic spine: Subtle foci of increased signal intensity in the lower thoracic spinal cord, unchanged from 01-11-2021. No new findings  12-2021 MRI brain: Periventricular white matter signal abnormalities unchanged from 01-11-2021. Abnormal signal intesnity in the left temporal lobe. Unchanged from 01-11-2021. No new findings.   03-2022 Having weakness after a seizure a few weeks ago. Dr [facility] office suggested supportive device. No med changes.   [phone] Dr Andee Poles has scheduled with home health for physical therapy and is supposed to order a rollator walker. Only started last week.   11-2022 Notes some worsening of symptoms. Sees Neuro next month.   02-2023 Continues to have worsening of symptoms. States they are considering doing a myelogram.   05-2023 Reports had MRI. Symptoms continue to progress. Has physical therapy coming in.     Seizures  Sounds like absence type. Previously grand mal.  Currently on oxcarbazepine at maximum dose.  10-2018 Referral placed to Dr Marta Antu. Apt scheduled for 11-25-2018.   * Was changed to [facility]. Scheduled for 02-09-2019.  01-2019 Patient called crying. Notes her apt with neurology was rescheduled. She will be out of medication.  04-2019 Now following with Dr Andee Poles in [place].  [phone] No Changes  04-2020 No changes. They are recommending she be on Brand Only medication.  01-2021 no changes  03-2022 Had severe seizure. Now having weakness.   [phone] still having semi regular seizures. Nothing severe though.     Iron deficiency anemia  On replacement  Labs on 01-10-2019 showed hemoglobin 12.3  Labs on 10-17-2019 showed hemoglobin 12.2  Labs on 10-22-2020 showed hemoglobin 12.1  Labs on 11/05/2021 showed hemoglobin 12.1, iron 47, transferrin 189, ferritin 22.  All other normal  Labs on 06/26/2022 showed hemoglobin  12.2, iron 50, transferrin 221, ferritin 14  Labs on 11/06/2022 showed hemoglobin 12.6, iron studies normal    Chronic depression  Doing well with Prozac    Urinary Incontinence  Doing well with oxybutynin  We did not originally start this medication  Some risk of crossing BBB, and worsening neuro symptoms    Recurrent Diarrhea  Ongoing since at least 2011 per records  Patient thinks it may have something to do with milk, but tends to end up eating/drinking dairy products anyway  Have offered to refer patient to GI, but she refuses  Patient was prescribed Cholestyramine, which works well when she takes  CT 03-2021 was negative for causative etiology, but did note hepatic cysts    Mammogram 05-15-22 Birads 2    ROS:  10 systems reviewed and were negative except as noted.   + fatigue  + abnormal gait, back pain, joint pain, muscle weakness  + seizures  + anxiety, depression    Objective :  BP (!) 155/76 (Site: Right Arm, Patient Position: Sitting, Cuff Size: Adult)   Pulse 65   Ht 1.626 m (5\' 4" )   Wt 64.4 kg (142 lb)   SpO2 98%   BMI 24.37 kg/m       General:  appears in good health, comfortable  Lungs:  Breathing nonlabored, Clear to auscultation bilaterally.   Cardiovascular:  regular rate and rhythm, S1, S2 normal, no murmur, click, rub or gallop  Psychiatric:  Normal    Data reviewed:      Assessment/Plan:  Assessment/Plan   1. Multiple sclerosis (CMS HCC)    2. Seizure disorder (CMS HCC)    3. Iron deficiency anemia, unspecified iron deficiency anemia type    4. Chronic diarrhea of unknown origin    5. Mixed stress and urge urinary incontinence    6. Hepatic cyst    7. Recurrent major depressive disorder, in partial remission (CMS HCC)        Multiple sclerosis  Follow with Neuro.     Seizure disorder  Follow with Neuro.    Iron deficiency anemia  Continue replacement    Depression  Continue Prozac    Hepatic Cysts  Monitor    Urinary Incontinence  Continue ditropan    Recurrent Diarrhea  Avoid dairy  Use  Lactaid or similar when cannot avoid  Use Cholestyramine to help with symptom control  Can refer to GI for further evaluation    States had pap through [facility]. Also notes FIT test at that time which was also negative.     Will see if we can get the MRI done at [facility])    Mammogram due    Terald Sleeper, DO, [facility]  Internal Medicine

## 2023-05-26 ENCOUNTER — Ambulatory Visit (HOSPITAL_COMMUNITY): Payer: Self-pay

## 2023-06-10 ENCOUNTER — Telehealth (INDEPENDENT_AMBULATORY_CARE_PROVIDER_SITE_OTHER): Payer: Self-pay | Admitting: INTERNAL MEDICINE

## 2023-06-10 NOTE — Telephone Encounter (Signed)
 L/m for pt mammo on Wednesday April 23 at 11:00 am. Referral mailed

## 2023-06-16 ENCOUNTER — Other Ambulatory Visit: Payer: Self-pay

## 2023-06-16 ENCOUNTER — Encounter (INDEPENDENT_AMBULATORY_CARE_PROVIDER_SITE_OTHER): Payer: Self-pay | Admitting: INTERNAL MEDICINE

## 2023-06-16 ENCOUNTER — Ambulatory Visit: Payer: Self-pay | Attending: INTERNAL MEDICINE | Admitting: INTERNAL MEDICINE

## 2023-06-16 DIAGNOSIS — G40909 Epilepsy, unspecified, not intractable, without status epilepticus: Secondary | ICD-10-CM | POA: Insufficient documentation

## 2023-06-16 DIAGNOSIS — G35 Multiple sclerosis: Secondary | ICD-10-CM | POA: Insufficient documentation

## 2023-06-16 DIAGNOSIS — Z79899 Other long term (current) drug therapy: Secondary | ICD-10-CM | POA: Insufficient documentation

## 2023-06-16 DIAGNOSIS — F3341 Major depressive disorder, recurrent, in partial remission: Secondary | ICD-10-CM | POA: Insufficient documentation

## 2023-06-16 DIAGNOSIS — Z0000 Encounter for general adult medical examination without abnormal findings: Secondary | ICD-10-CM | POA: Insufficient documentation

## 2023-06-16 DIAGNOSIS — D509 Iron deficiency anemia, unspecified: Secondary | ICD-10-CM | POA: Insufficient documentation

## 2023-06-16 DIAGNOSIS — N3946 Mixed incontinence: Secondary | ICD-10-CM | POA: Insufficient documentation

## 2023-06-16 NOTE — Nursing Note (Signed)
 06/16/23 1042   Medicare Wellness Assessment   Medicare initial or wellness physical in the last year? No   Advance Directives   Does patient have a living will or MPOA Yes   Has patient provided [employer] with a copy? Yes   Activities of Daily Living   Do you need help with dressing, bathing, or walking? No   Do you need help with shopping, housekeeping, medications, or finances? Yes   Do you have rugs in hallways, broken steps, or poor lighting? No   Do you have grab bars in your bathroom, non-slip strips in your tub, and hand rails on your stairs? Yes   Cognitive Function Screen   What is you age? 1   What is the time to the nearest hour? 1   What is the year? 0   What is the name of this clinic? 1   Can the patient recognize two persons (the doctor, the nurse, home help, etc.)? 0   What is the date of your birth? (day and month sufficient)  1   In what year did World War II end? 0   Who is the current president of the [place] ? 1   Count from 20 down to 1? 1   What address did I give you earlier? 0   Total Score 6   Interpretation of Total Score 4-6 Moderate Impairment   Depression Screen   Little interest or pleasure in doing things. 0   Feeling down, depressed, or hopeless 0   PHQ 2 Total 0   Pain Score   Pain Score Four   Substance Use Screening   In Past 12 MONTHS, how often have you used any tobacco product (for example, cigarettes, e-cigarettes, cigars, pipes, or smokeless tobacco)? Never   In the PAST 12 MONTHS, how often have you had 5 (men)/4 (women) or more drinks containing alcohol in one day? Never   In the PAST 12 months, how often have you used any prescription medications just for the feeling, more than prescribed, or that were not prescribed for you? Prescriptions may include: opioids, benzodiazepines, medications for ADHD Never   In the PAST 12 MONTHS, how often have you used any drugs, including marijuana, cocaine or crack, heroin, methamphetamine, hallucinogens, ecstasy/MDMA?  Never   Fall Risk Assessment   Do you feel unsteady when standing or walking? Yes   Do you worry about falling? Yes   Have you fallen in the past year? Yes   How many times have you fallen? 2 or more times   Were you ever injured from falling? No

## 2023-06-16 NOTE — Patient Instructions (Signed)
 Medicare Preventive Services  Medicare coverage information Recommendation for YOU   Heart Disease and Diabetes   Lipid profile Every 5 years or more often if at risk for cardiovascular disease     Lab Results   Component Value Date    CHOLESTEROL 202 (H) 11/05/2021    HDLCHOL 52 11/05/2021    LDLCHOL 136 (H) 11/05/2021    TRIG 72 11/05/2021         Diabetes Screening    Yearly for those at risk for diabetes, 2 tests per year for those with prediabetes Last Glucose: 102    Diabetes Self Management Training or Medical Nutrition Therapy  For those with diabetes, up to 10 hrs initial training within a year, subsequent years up to 2 hrs of follow up training Optional for those with diabetes     Medical Nutrition Therapy  Three hours of one-on-one counseling in first year, two hours in subsequent years Optional for those with diabetes, kidney disease   Intensive Behavioral Therapy for Obesity  Face-to-face counseling, first month every week, month 2-6 every other week, month 7-12 every month if continued progress is documented Optional for those with Body Mass Index 30 or higher  Your There is no height or weight on file to calculate BMI.   Tobacco Cessation (Quitting) Counseling   Covers up to 8 smoking and tobacco-use cessation counseling sessions in a 51-month period.    Optional for those that use tobacco   Cancer Screening Last Completion Date   Colorectal screening   For anyone age 38 to 73 or any age if high risk:  Screening Colonoscopy every 10 yrs if low risk,  more frequent if higher risk  OR  Cologuard Stool DNA test once every 3 years OR  Fecal Occult Blood Testing yearly OR  Flexible  Sigmoidoscopy  every 5 yr OR  CT Colonography every 5 yrs      See below for due date if applicable.   Screening Pap Test   Recommended every 3 years for all women age 58 to 53, or every five years if combined with HPV test (routine screening not needed after total hysterectomy).  Medicare covers every 2 years or yearly if high  risk.  Screening Pelvic Exam   Medicare covers every 2 years, yearly if high risk or childbearing age with abnormal Pap in last 3 yrs.     See below for due date if applicable.   Screening Mammogram   Recommended every 2 years for women age 45 to 50, or more frequent if you have a higher risk. Selectively recommended for women between 40-49 based on shared decisions about risk. Covered by Medicare up to every year for women age 61 or older --05/15/2022  See below for due date if applicable.         Lung Cancer Screening  Annual low dose computed tomography (LDCT scan) is recommended for those age 57-80 who smoked 20 pack-years and are current smokers or quit smoking within past 15 years, after counseling by your doctor or nurse clinician about the possible benefits or harms.     See below for due date if applicable.   Vaccinations   Respiratory syncytial virus (RSV)  Age 18 years or older: Based on shared clinical decision-making with your provider.  Pneumococcal Vaccine  Recommended routinely age 60+ with one or two separate vaccines based on your risk. Recommended before age 21 if medical conditions with increased risk  Seasonal Influenza Vaccine  Once every  flu season   Hepatitis B Vaccine  3 doses if risk (including anyone with diabetes or liver disease)  Shingles Vaccine  Two doses at age 57 or older  Diphtheria Tetanus Pertussis Vaccine  ONCE as adult, booster every 10 years     Immunization History   Administered Date(s) Administered    Covid-19 Vaccine,Pfizer-BioNTech,Purple Top,73yrs+ 05/20/2019, 06/10/2019     Shingles vaccine and Diphtheria Tetanus Pertussis vaccines are available at pharmacies or local health department without a prescription.   Other Preventative Screening  Last Completion Date   Bone Densitometry   Screening: All females ages 49 and older every 10 years if initial screening normal. Postmenopausal women ages 54-64 need screening with one or more risk factor: previous fracture, parental  hip fracture, current smoker, low body weight, excessive alcohol use, Rheumatoid Arthritis   For women with diagnosed Osteoporosis, follow up is recommended every 2 years or a frequency recommended by your provider.       See below for due date if applicable.     Glaucoma Screening   Yearly if in high risk group such as diabetes, family history, African American age 34+ or Hispanic American age 63+   See your eye care provider for screening.   Hepatitis C Screening   Recommended  for those born between ages 18-79 years.     See below for due date if applicable.     HIV Testing  Recommended routinely at least ONCE, covered every year for age 22 to 34 regardless of risk, and every year for age over 58 who ask for the test or higher risk. Yearly or up to 3 times in pregnancy         See below for due date if applicable.   Abdominal Aortic Aneurysm Screening Ultrasound   Once with a family history of abdominal aortic aneurysms OR a female between 72-75 and have smoked at least 100 cigarettes in your lifetime.         See below for due date if applicable.       Your Personalized Schedule for Preventive Tests     Health Maintenance: Pending and Last Completed         Date Due Completion Date    Adult Tdap-Td (1 - Tdap) Never done ---    Hepatitis B Vaccine (1 of 3 - 19+ 3-dose series) Never done ---    Shingles Vaccine (1 of 2) Never done ---    Pneumococcal Vaccination, Age 92+ (1 of 2 - PCV) Never done ---    Pap smear/HPV Never done ---    Colonoscopy Never done ---    Covid-19 Vaccine (3 - Pfizer risk series) 07/08/2019 06/10/2019    Hepatitis C screening 06/26/2023 (Originally 29-Jan-1971) ---    HIV Screening 06/26/2023 (Originally 08/19/1986) ---    Influenza Vaccine (Season Ended) 11/02/2023 ---    Breast Cancer Screening 05/14/2024 05/15/2022                  For Information on Advanced Directives for Health Care:  [place]:  [email or web address]  [place], [place], [place], [place] General Information:  [email or web address]

## 2023-06-16 NOTE — Nursing Note (Signed)
 06/16/23 1000   Comprehensive Health Assessment-Adult   Do you wish to complete this form? Yes   During the past 4 weeks, how would you rate your health in general? Fair   During the past 4 weeks, how much difficulty have you had doing your usual activities inside and outside your home because of medical or emotional problems? Much difficulty   During the past 4 weeks, was someone available to help you if you needed and wanted help? Yes, as much as I wanted   In the past year, how many times have you gone to the emergency department or been admitted to a hospital for a health problem? None   Are you generally satisfied with your sleep? Yes   Do you have enough money to buy things you need in everyday life, such as food, clothing, medicines, and housing? Sometimes   Can you get to places beyond walking distance without help?  (For example, can you drive your own car or travel alone on buses)? No   Do you fasten your seatbelt when you are in a car? Yes, usually   Do you exercise 20 minutes 3 or more days per week (such as walking, dancing, biking, mowing grass, swimming)? Yes, some of the time   How often do you eat food that is healthy (fruits, vegetables, lean meats) instead of unhealthy (sweets, fast food, junk food, fatty foods)? Most of the time   Have your parents, brothers or sisters had any of the following problems before the age of 73? (check all that apply) Heart problems, or hardening of the arteries;Diabetes (sugar);Cancer;Mental health problems such as depression, bipolar, severe anxiety, postpartum depression   How often do you have trouble taking medicines the eay you are told to take them? I always take them as prescribed   Do you need any help communicating with your doctors and nurses because of vision or hearing problems? No   During the past 12 months, have you experienced confusion or memory loss that is happening more often or is getting worse? Possibly, I am not sure   Do you have one person  you think of as your personal doctor (primary care provider or family doctor)? Yes   If you are seeing a Primary Care Provider (PCP) or family doctor. please list their name [facility]   Are you now also seeing any specialist physician(s) (such as eye doctor, foot doctor, skin doctor)? Yes   If you are seeing a specialist for anything such as foot, eye, skin, etc.  please list their name(s) Bautista   How confident are you that you can control or manage most of your health problems? Very confident

## 2023-06-16 NOTE — Nursing Note (Signed)
 Patient is for her telehealth medicare wellness.

## 2023-06-16 NOTE — Progress Notes (Signed)
 [facility]  [address] [place], [place]  [address]  Phone: [phone] Fax: [phone]    Name: Carol Barajas                       Date of Birth: 1970/07/24   MRN:  [id number]                         Date of visit: 06/16/2023     Chief Complaint: Medicare Annual    Current Outpatient Medications   Medication Sig    cholestyramine-sucrose (QUESTRAN) 4 gram Oral Powder in Packet Take 1 Packet by mouth Twice daily for 180 days    ergocalciferol, vitamin D2, (DRISDOL) 1,250 mcg (50,000 unit) Oral Capsule Take 1 Capsule (50,000 Units total) by mouth Every 7 days for 180 days    ferrous sulfate (FEOSOL) 325 mg (65 mg iron) Oral Tablet Take 1 Tablet (325 mg total) by mouth Once a day for 180 days    FLUoxetine (PROZAC) 40 mg Oral Capsule Take 1 Capsule (40 mg total) by mouth Once a day for 180 days    KESIMPTA PEN 20 mg/0.4 mL Subcutaneous Pen Injector Inject 0.4 mL (20 mg total) under the skin Every 30 days    OXcarbazepine (TRILEPTAL) 600 mg Oral Tablet Take 1 Tablet (600 mg total) by mouth Twice daily    oxyBUTYnin (DITROPAN) 5 mg Oral Tablet Take 1 Tablet (5 mg total) by mouth Once a day for 180 days       Patient Active Problem List    Diagnosis Date Noted    Urinary incontinence 08/02/2021    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  Gastroenteritis         Subjective:   Patient is here for telehealth medicare wellness visit.    I personally offered the service to the patient, and obtained verbal consent to provide this service.    I have reviewed and reconciled the medication list with the patient today.    Comprehensive Health Assessment:  Patient verbal responses recorded in flowsheet       06/16/2023    10:00 AM 03/18/2022     10:00 AM   Comprehensive Health Assessment-Adult   Do you wish to complete this form? Yes Yes   During the past 4 weeks, how would you rate your health in general? Fair Fair   During the past 4 weeks, how much difficulty have you had doing your usual activities inside and outside your home because of medical or emotional problems? Much difficulty Some difficulty   During the past 4 weeks, was someone available to help you if you needed and wanted help? Yes, as much as I wanted Yes, as much as I wanted   In the past year, how many times have you gone to the emergency department or been admitted to a hospital for a health problem? None None   Are you generally satisfied with your sleep? Yes Yes   Do you have enough money to buy things you need in everyday life, such as food, clothing, medicines, and housing? Sometimes Yes, always   Can you get to  places beyond walking distance without help?  (For example, can you drive your own car or travel alone on buses)? No No   Do you fasten your seatbelt when you are in a car? Yes, usually Yes, usually   Do you exercise 20 minutes 3 or more days per week (such as walking, dancing, biking, mowing grass, swimming)? Yes, some of the time No, I usually don't exercise this much   How often do you eat food that is healthy (fruits, vegetables, lean meats) instead of unhealthy (sweets, fast food, junk food, fatty foods)? Most of the time Most of the time   Have your parents, brothers or sisters had any of the following problems before the age of 58? (check all that apply) Heart problems, or hardening of the arteries;Diabetes (sugar);Cancer;Mental health problems such as depression, bipolar, severe anxiety, postpartum depression Heart problems, or hardening of the arteries;Cancer;High cholesterol;Alcohol or drug addiction (or abuse)   How often do you have trouble taking medicines the eay you are told to take them? I always take them as prescribed I always take them as prescribed   Do  you need any help communicating with your doctors and nurses because of vision or hearing problems? No Yes   During the past 12 months, have you experienced confusion or memory loss that is happening more often or is getting worse? Possibly, I am not sure Yes   Do you have one person you think of as your personal doctor (primary care provider or family doctor)? Yes Yes   If you are seeing a Primary Care Provider (PCP) or family doctor. please list their name [name]   Are you now also seeing any specialist physician(s) (such as eye doctor, foot doctor, skin doctor)? Yes Yes   If you are seeing a specialist for anything such as foot, eye, skin, etc.  please list their name(s) [name]   How confident are you that you can control or manage most of your health problems? Very confident Somewhat confident       I have reviewed and updated as appropriate the past medical, family and social history. 06/16/2023 as summarized below:  Past Medical History:   Diagnosis Date    Anxiety     Convulsions     Multiple sclerosis (CMS HCC)      History reviewed. No pertinent surgical history.    Family Medical History:       Problem Relation (Age of Onset)    No Known Problems Mother, Father, Sister, Maternal Grandmother, Maternal Grandfather, Paternal Grandmother, Paternal Grandfather, Daughter, Son, Maternal Aunt, Maternal Uncle, Paternal Aunt, Paternal Uncle, Other    Primary Brain tumor Brother            Social History     Socioeconomic History    Marital status: Divorced    Number of children: 1   Tobacco Use    Smoking status: Never    Smokeless tobacco: Never   Vaping Use    Vaping status: Never Used   Substance and Sexual Activity    Alcohol use: Never    Drug use: Never     Social Determinants of Health     Health Literacy: Low Risk  (02/09/2023)    Health Literacy     SDOH Health Literacy: Never         List of Current Health Care Providers   Care Team       PCP       Name Type Specialty  Phone Number     Gwenith Lente, DO Physician INTERNAL MEDICINE [phone]              Care Team       Name Type Specialty Phone Number    Geradine Kitten, MD Not available PSYCHIATRY AND NEUROLOGY-NEUROLOGY [phone]                      Health Maintenance   Topic Date Due    Adult Tdap-Td (1 - Tdap) Never done    Hepatitis B Vaccine (1 of 3 - 19+ 3-dose series) Never done    Shingles Vaccine (1 of 2) Never done    Pneumococcal Vaccination, Age 40+ (1 of 2 - PCV) Never done    Pap smear/HPV  Never done    Colonoscopy  Never done    Covid-19 Vaccine (3 - Pfizer risk series) 07/08/2019    Hepatitis C screening  06/26/2023 (Originally Feb 20, 1971)    HIV Screening  06/26/2023 (Originally 08/19/1986)    Influenza Vaccine (Season Ended) 11/02/2023    Breast Cancer Screening  05/14/2024    Medicare Annual Wellness Visit - Calendar Year Insurers  Completed     Medicare Wellness Assessment   Medicare initial or wellness physical in the last year?: No  Advance Directives   Does patient have a living will or MPOA: Yes   Has patient provided [employer] with a copy?: Yes              Activities of Daily Living   Do you need help with dressing, bathing, or walking?: No   Do you need help with shopping, housekeeping, medications, or finances?: Yes   Do you have rugs in hallways, broken steps, or poor lighting?: No   Do you have grab bars in your bathroom, non-slip strips in your tub, and hand rails on your stairs?: Yes   Cognitive Function Screen (1=Yes, 0=No)   What is you age?: Correct   What is the time to the nearest hour?: Correct   What is the year?: Incorrect   What is the name of this clinic?: Correct   Can the patient recognize two persons (the doctor, the nurse, home help, etc.)?: Incorrect   What is the date of your birth? (day and month sufficient) : Correct   In what year did World War II end?: Incorrect   Who is the current president of the [place] ?: Correct   Count from 20 down to 1?: Correct   What address did  I give you earlier?: Incorrect   Total Score: 6   Interpretation of Total Score: 4-6 Moderate Impairment   Fall Risk Screen   Do you feel unsteady when standing or walking?: Yes  Do you worry about falling?: Yes  Have you fallen in the past year?: Yes  How many times have you fallen?: 2 or more times  Were you ever injured from falling?: No   Depression Screen     Little interest or pleasure in doing things.: Not at all  Feeling down, depressed, or hopeless: Not at all  PHQ 2 Total: 0     Pain Score   Pain Score:   4    Substance Use-Abuse Screening     Tobacco Use     In Past 12 MONTHS, how often have you used any tobacco product (for example, cigarettes, e-cigarettes, cigars, pipes, or smokeless tobacco)?: Never     Alcohol use     In  the PAST 12 MONTHS, how often have you had 5 (men)/4 (women) or more drinks containing alcohol in one day?: Never     Prescription Drug Use     In the PAST 12 months, how often have you used any prescription medications just for the feeling, more than prescribed, or that were not prescribed for you? Prescriptions may include: opioids, benzodiazepines, medications for ADHD: Never           Illicit Drug Use   In the PAST 12 MONTHS, how often have you used any drugs, including marijuana, cocaine or crack, heroin, methamphetamine, hallucinogens, ecstasy/MDMA?: Never                 ROS:  10 systems reviewed and were negative except as noted.   + fatigue  + abnormal gait, back pain, joint pain, muscle weakness  + seizures  + anxiety, depression       OBJECTIVE:   There were no vitals taken for this visit.       Other appropriate exam:    General:  appears in good health, comfortable  Psychiatric:  Normal    Health Maintenance Due   Topic Date Due    Adult Tdap-Td (1 - Tdap) Never done    Hepatitis B Vaccine (1 of 3 - 19+ 3-dose series) Never done    Shingles Vaccine (1 of 2) Never done    Pneumococcal Vaccination, Age 75+ (1 of 2 - PCV) Never done    Pap smear/HPV  Never done     Colonoscopy  Never done    Covid-19 Vaccine (3 - Pfizer risk series) 07/08/2019      ASSESSMENT & PLAN:   Assessment/Plan   1. Multiple sclerosis (CMS HCC)    2. Seizure disorder (CMS HCC)    3. Mixed stress and urge urinary incontinence    4. Iron deficiency anemia, unspecified iron deficiency anemia type    5. Recurrent major depressive disorder, in partial remission (CMS HCC)       Identified Risk Factors/ Recommended Actions     Will order labs at next CDM visit.     No orders of the defined types were placed in this encounter.       The patient has been educated about risk factors and recommended preventive care. Written Prevention Plan completed/ updated and given to patient (see After Visit Summary).    Follow up as scheduled for chronic disease management.     Multiple sclerosis  Follow with Neuro.     Seizure disorder  Follow with Neuro.    Iron deficiency anemia  Continue replacement    Depression  Continue Prozac    Urinary Incontinence  Continue ditropan    Suspect mentation issues due to combination of seizure disorder and multiple sclerosis as well as treatments therefor. Recommend to discuss with Neuro    Mammogram due - patient no showed last month. Currently scheduled 06-24-23.     Gwenith Lente, DO, [facility]  Internal Medicine

## 2023-06-24 ENCOUNTER — Ambulatory Visit (HOSPITAL_COMMUNITY): Payer: Self-pay

## 2023-07-09 ENCOUNTER — Ambulatory Visit (HOSPITAL_COMMUNITY): Payer: Self-pay

## 2023-07-21 ENCOUNTER — Encounter (INDEPENDENT_AMBULATORY_CARE_PROVIDER_SITE_OTHER): Payer: Self-pay

## 2023-08-17 ENCOUNTER — Encounter (INDEPENDENT_AMBULATORY_CARE_PROVIDER_SITE_OTHER): Payer: Self-pay | Admitting: INTERNAL MEDICINE

## 2023-08-17 ENCOUNTER — Ambulatory Visit (INDEPENDENT_AMBULATORY_CARE_PROVIDER_SITE_OTHER): Payer: Self-pay | Admitting: INTERNAL MEDICINE

## 2023-08-17 NOTE — Progress Notes (Deleted)
 [facility] Internal Medicine  [address] [place], [place]  [address]  Phone: [phone] Fax: [phone]    Name: Carol Barajas                       Date of Birth: March 27, 1970   MRN:  [id number]                         Date of visit: 08/17/2023     Chief Complaint: No chief complaint on file.    Current Outpatient Medications   Medication Sig    cholestyramine -sucrose (QUESTRAN ) 4 gram Oral Powder in Packet Take 1 Packet by mouth Twice daily for 180 days    ergocalciferol , vitamin D2, (DRISDOL ) 1,250 mcg (50,000 unit) Oral Capsule Take 1 Capsule (50,000 Units total) by mouth Every 7 days for 180 days    ferrous sulfate  (FEOSOL) 325 mg (65 mg iron ) Oral Tablet Take 1 Tablet (325 mg total) by mouth Once a day for 180 days    FLUoxetine  (PROZAC ) 40 mg Oral Capsule Take 1 Capsule (40 mg total) by mouth Once a day for 180 days    KESIMPTA PEN 20 mg/0.4 mL Subcutaneous Pen Injector Inject 0.4 mL (20 mg total) under the skin Every 30 days    OXcarbazepine (TRILEPTAL) 600 mg Oral Tablet Take 1 Tablet (600 mg total) by mouth Twice daily    oxyBUTYnin  (DITROPAN ) 5 mg Oral Tablet Take 1 Tablet (5 mg total) by mouth Once a day for 180 days       Patient Active Problem List    Diagnosis Date Noted    Urinary incontinence 08/02/2021    Hepatic cyst 08/02/2021    Major depression, recurrent 08/02/2021    Iron  deficiency anemia 01/14/2011    Multiple sclerosis (CMS HCC) 01/14/2011    Seizure disorder (CMS HCC) 01/13/2011    Chronic diarrhea of unknown origin 05/25/2009     Formatting of this note might be different from the original.  Gastroenteritis         Subjective:   Patient is here for CDM visit.    SureScripts only showing regular refills for trileptal and kesimpta.     Patient canceled or no showed apts for mammograms.     Hepatic Cysts  Originally identified on extended imaging performed by Dr Donnie Galea  03-05-21 Multiple hepatic cysts are identified.  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    Multiple  sclerosis  Currently on Kesimpta  Patient was informed in March 2020 that I was not comfortable treating her MS.  Largely due to her continued deterioration and most likely need for medication adjustment in the near future.  Patient was supposed to call back with name of neurology she would like to be referred to, but she did not do so  Was following with Dr.Paudyal at his departure.  The nurse practitioners at this clinic resumed her medication, but had not really performing any monitoring.  Symptoms are gradually getting worse per patient and available documentation.  Had not seen a neurologist since August 2016 from any record I can find.  10-2018 Referral placed to Dr Eliott Guess. Apt scheduled for 11-25-2018.   Scheduled with [facility]. Scheduled for 02-09-2019.  01-2019 Patient called crying. Notes her apt with neurology was rescheduled. She will be out of medication.   *At appointment today we called Dr. Rue Cota office.  Secretary did note that patient was scheduled and had appointment  canceled.  Patient had called and informed that she had just gone to a large wedding.  No masks or social distancing.  They did not feel safe bringing patient into office, but did offer to reschedule.  Patient refused.  02-2019 MRI There is abnormal signal predominately involving the inferomedial left temporal lobe with atrophic changes.  On T2 coronal, there is cortical thinning and subcortical increased T2 signal.  The abnormal white matter signal extends posteriorly to a point adjacent to the left occipital horn, similar to previous.  This is unchanged to 2015.  04-2019 Now following with Dr Donnie Galea in [place]  10-2019 No changes  04-2020 Noting more gradual weakness in legs, especially right. Now using cane occasionally. Not using today. No changes in medication, but they are recommending Brand Only medication.  [phone] Continuing to have gradual weakness.  01-2021 Patient given steroids. Doing much better.  04-2021 Now on  Kesimpta. Notes more trouble with gait.  07-2021 Still having a lot of trouble with gait.   [phone] Gait back to baseline  12-2021 MRI thoracic spine: Subtle foci of increased signal intensity in the lower thoracic spinal cord, unchanged from 01-11-2021. No new findings  12-2021 MRI brain: Periventricular white matter signal abnormalities unchanged from 01-11-2021. Abnormal signal intesnity in the left temporal lobe. Unchanged from 01-11-2021. No new findings.   03-2022 Having weakness after a seizure a few weeks ago. Dr [facility]'s office suggested supportive device. No med changes.   [phone] Dr Donnie Galea has scheduled with home health for physical therapy and is supposed to order a rollator walker. Only started last week.   11-2022 Notes some worsening of symptoms. Sees Neuro next month.   02-2023 Continues to have worsening of symptoms. States they are considering doing a myelogram.   05-2023 Reports had MRI. Symptoms continue to progress. Has physical therapy coming in.   06-2023 Neuro notes no change in MRI brain/C spine. T spine actually showed resolution of lesion from 2022.     Seizures  Sounds like absence type. Previously grand mal.  Currently on oxcarbazepine at maximum dose.  10-2018 Referral placed to Dr Eliott Guess. Apt scheduled for 11-25-2018.   * Was changed to [facility]. Scheduled for 02-09-2019.  01-2019 Patient called crying. Notes her apt with neurology was rescheduled. She will be out of medication.  04-2019 Now following with Dr Donnie Galea in [place].  [phone] No Changes  04-2020 No changes. They are recommending she be on Brand Only medication.  01-2021 no changes  03-2022 Had severe seizure. Now having weakness.   [id number] still having semi regular seizures. Nothing severe though.   [phone] Neuro notes last seizure was 11-2022 and was stress related. Dr Donnie Galea questions if non epilectic. States last confirmed seizure was late 2023.     Iron  deficiency anemia  On replacement  Labs on 01-10-2019 showed  hemoglobin 12.3  Labs on 10-17-2019 showed hemoglobin 12.2  Labs on 10-22-2020 showed hemoglobin 12.1  Labs on 11/05/2021 showed hemoglobin 12.1, iron  47, transferrin 189, ferritin 22.  All other normal  Labs on 06/26/2022 showed hemoglobin 12.2, iron  50, transferrin 221, ferritin 14  Labs on 11/06/2022 showed hemoglobin 12.6, iron  studies normal    Chronic depression  Doing well with Prozac     Urinary Incontinence  Doing well with oxybutynin   We did not originally start this medication  Some risk of crossing BBB, and worsening neuro symptoms    Recurrent Diarrhea  Ongoing since at least 2011 per records  Patient thinks it  may have something to do with milk, but tends to end up eating/drinking dairy products anyway  Have offered to refer patient to GI, but she refuses  Patient was prescribed Cholestyramine , which works well when she takes  CT 03-2021 was negative for causative etiology, but did note hepatic cysts    Mammogram 05-15-22 [facility] 2    ROS:  10 systems reviewed and were negative except as noted.   + fatigue  + abnormal gait, back pain, joint pain, muscle weakness  + seizures  + anxiety, depression    Objective :  There were no vitals taken for this visit.      General:  appears in good health, comfortable  Lungs:  Breathing nonlabored, Clear to auscultation bilaterally.   Cardiovascular:  regular rate and rhythm, S1, S2 normal, no murmur, click, rub or gallop  Psychiatric:  Normal    Data reviewed:  Neuro note - no change. F/U 6 months.     Assessment/Plan:  Assessment/Plan   1. Multiple sclerosis (CMS HCC)    2. Seizure disorder (CMS HCC)    3. Mixed stress and urge urinary incontinence    4. Chronic diarrhea of unknown origin    5. Iron  deficiency anemia, unspecified iron  deficiency anemia type    6. Recurrent major depressive disorder, in partial remission (CMS HCC)    7. Hepatic cyst      Multiple sclerosis  Follow with Neuro.     Seizure disorder  Follow with Neuro.    Iron  deficiency  anemia  Continue replacement    Depression  Continue Prozac     Hepatic Cysts  Monitor    Urinary Incontinence  Continue ditropan     Recurrent Diarrhea  Avoid dairy  Use Lactaid or similar when cannot avoid  Use Cholestyramine  to help with symptom control  Can refer to GI for further evaluation    Gwenith Lente, DO, [facility]  Internal Medicine

## 2023-12-01 ENCOUNTER — Other Ambulatory Visit (INDEPENDENT_AMBULATORY_CARE_PROVIDER_SITE_OTHER): Payer: Self-pay

## 2023-12-31 ENCOUNTER — Encounter (INDEPENDENT_AMBULATORY_CARE_PROVIDER_SITE_OTHER): Payer: Self-pay

## 2024-06-22 ENCOUNTER — Ambulatory Visit (INDEPENDENT_AMBULATORY_CARE_PROVIDER_SITE_OTHER): Payer: Self-pay | Admitting: INTERNAL MEDICINE
